# Patient Record
Sex: Female | Born: 1970 | Race: Black or African American | Hispanic: No | Marital: Married | Smoking: Never smoker
Health system: Southern US, Community
[De-identification: ages and names within clinical notes are randomized; demographics above are authoritative.]

## PROBLEM LIST (undated history)

## (undated) DIAGNOSIS — I10 Essential (primary) hypertension: Secondary | ICD-10-CM

---

## 2000-10-23 ENCOUNTER — Emergency Department (HOSPITAL_COMMUNITY): Admission: EM | Admit: 2000-10-23 | Discharge: 2000-10-23 | Payer: Self-pay | Admitting: Emergency Medicine

## 2010-07-16 ENCOUNTER — Emergency Department (HOSPITAL_COMMUNITY): Payer: BC Managed Care – PPO

## 2010-07-16 ENCOUNTER — Emergency Department (HOSPITAL_COMMUNITY)
Admission: EM | Admit: 2010-07-16 | Discharge: 2010-07-16 | Disposition: A | Discharge status: Home / Self Care | Payer: BC Managed Care – PPO | Attending: Emergency Medicine | Admitting: Emergency Medicine

## 2010-07-16 DIAGNOSIS — M25519 Pain in unspecified shoulder: Secondary | ICD-10-CM | POA: Insufficient documentation

## 2010-07-16 DIAGNOSIS — D649 Anemia, unspecified: Secondary | ICD-10-CM | POA: Insufficient documentation

## 2010-07-16 DIAGNOSIS — R0602 Shortness of breath: Secondary | ICD-10-CM | POA: Insufficient documentation

## 2010-07-16 DIAGNOSIS — R071 Chest pain on breathing: Secondary | ICD-10-CM | POA: Insufficient documentation

## 2010-07-16 LAB — DIFFERENTIAL
Basophils Absolute: 0 10*3/uL (ref 0.0–0.1)
Basophils Relative: 0 % (ref 0–1)
Lymphocytes Relative: 39 % (ref 12–46)
Monocytes Absolute: 0.5 10*3/uL (ref 0.1–1.0)
Neutro Abs: 3.7 10*3/uL (ref 1.7–7.7)
Neutrophils Relative %: 53 % (ref 43–77)

## 2010-07-16 LAB — CBC
HCT: 30 % — ABNORMAL LOW (ref 36.0–46.0)
Hemoglobin: 9.6 g/dL — ABNORMAL LOW (ref 12.0–15.0)
MCHC: 32 g/dL (ref 30.0–36.0)
WBC: 7 10*3/uL (ref 4.0–10.5)

## 2010-07-16 LAB — POCT CARDIAC MARKERS
CKMB, poc: 5.1 ng/mL (ref 1.0–8.0)
Myoglobin, poc: 165 ng/mL (ref 12–200)

## 2010-07-16 LAB — BASIC METABOLIC PANEL
CO2: 26 mEq/L (ref 19–32)
Calcium: 9.4 mg/dL (ref 8.4–10.5)
Creatinine, Ser: 0.98 mg/dL (ref 0.4–1.2)
GFR calc Af Amer: >60 mL/min (ref >60)
GFR calc non Af Amer: >60 mL/min (ref >60)
Glucose, Bld: 123 mg/dL — ABNORMAL HIGH (ref 70–99)

## 2010-07-16 LAB — PROTIME-INR: Prothrombin Time: 13.1 seconds (ref 11.6–15.2)

## 2010-07-16 MED ORDER — IOHEXOL 300 MG/ML  SOLN
100.0000 mL | Freq: Once | INTRAMUSCULAR | Status: AC | PRN
  Administered 2010-07-16: 100 mL via INTRAVENOUS

## 2010-11-24 ENCOUNTER — Other Ambulatory Visit: Payer: Self-pay | Admitting: Family Medicine

## 2010-11-24 DIAGNOSIS — Z1231 Encounter for screening mammogram for malignant neoplasm of breast: Secondary | ICD-10-CM

## 2010-12-02 ENCOUNTER — Ambulatory Visit
Admission: RE | Admit: 2010-12-02 | Discharge: 2010-12-02 | Disposition: A | Discharge status: Home / Self Care | Payer: BC Managed Care – PPO | Source: Ambulatory Visit | Attending: Family Medicine | Admitting: Family Medicine

## 2010-12-02 DIAGNOSIS — Z1231 Encounter for screening mammogram for malignant neoplasm of breast: Secondary | ICD-10-CM

## 2011-06-07 ENCOUNTER — Emergency Department (HOSPITAL_COMMUNITY): Payer: BC Managed Care – PPO

## 2011-06-07 ENCOUNTER — Encounter (HOSPITAL_COMMUNITY): Payer: Self-pay

## 2011-06-07 ENCOUNTER — Emergency Department (HOSPITAL_COMMUNITY)
Admission: EM | Admit: 2011-06-07 | Discharge: 2011-06-07 | Disposition: A | Discharge status: Home / Self Care | Payer: BC Managed Care – PPO | Attending: Emergency Medicine | Admitting: Emergency Medicine

## 2011-06-07 DIAGNOSIS — R1110 Vomiting, unspecified: Secondary | ICD-10-CM | POA: Insufficient documentation

## 2011-06-07 DIAGNOSIS — R197 Diarrhea, unspecified: Secondary | ICD-10-CM | POA: Insufficient documentation

## 2011-06-07 DIAGNOSIS — R1013 Epigastric pain: Secondary | ICD-10-CM

## 2011-06-07 LAB — COMPREHENSIVE METABOLIC PANEL
BUN: 15 mg/dL (ref 6–23)
CO2: 22 mEq/L (ref 19–32)
Chloride: 100 mEq/L (ref 96–112)
Creatinine, Ser: 0.93 mg/dL (ref 0.50–1.10)
GFR calc Af Amer: 87 mL/min — ABNORMAL LOW (ref >90)
GFR calc non Af Amer: 75 mL/min — ABNORMAL LOW (ref >90)
Glucose, Bld: 120 mg/dL — ABNORMAL HIGH (ref 70–99)
Total Bilirubin: 0.3 mg/dL (ref 0.3–1.2)

## 2011-06-07 LAB — URINALYSIS, ROUTINE W REFLEX MICROSCOPIC
Bilirubin Urine: NEGATIVE
Glucose, UA: NEGATIVE mg/dL
Hgb urine dipstick: NEGATIVE
Specific Gravity, Urine: 1.019 (ref 1.005–1.030)

## 2011-06-07 LAB — CBC
HCT: 34.1 % — ABNORMAL LOW (ref 36.0–46.0)
MCV: 74.9 fL — ABNORMAL LOW (ref 78.0–100.0)
RBC: 4.55 MIL/uL (ref 3.87–5.11)
WBC: 6.2 10*3/uL (ref 4.0–10.5)

## 2011-06-07 LAB — POCT PREGNANCY, URINE: Preg Test, Ur: NEGATIVE

## 2011-06-07 LAB — URINE MICROSCOPIC-ADD ON

## 2011-06-07 LAB — LIPASE, BLOOD: Lipase: 48 U/L (ref 11–59)

## 2011-06-07 MED ORDER — ONDANSETRON HCL 4 MG/2ML IJ SOLN
4.0000 mg | Freq: Once | INTRAMUSCULAR | Status: AC
  Administered 2011-06-07: 4 mg via INTRAVENOUS
  Filled 2011-06-07: qty 2

## 2011-06-07 MED ORDER — SODIUM CHLORIDE 0.9 % IV BOLUS (SEPSIS)
1000.0000 mL | Freq: Once | INTRAVENOUS | Status: AC
  Administered 2011-06-07: 1000 mL via INTRAVENOUS

## 2011-06-07 MED ORDER — HYDROMORPHONE HCL PF 1 MG/ML IJ SOLN
1.0000 mg | Freq: Once | INTRAMUSCULAR | Status: AC
  Administered 2011-06-07: 1 mg via INTRAVENOUS
  Filled 2011-06-07: qty 1

## 2011-06-07 MED ORDER — RANITIDINE HCL 150 MG PO TABS: 150.0000 mg | ORAL_TABLET | Freq: Two times a day (BID) | ORAL | Status: AC

## 2011-06-07 NOTE — ED Notes (Signed)
Pt c/o abdominal pain since last week, states dx with GI virus. States woke up at 4am with severe abdominal pain radiating through to back with n/v/d.

## 2011-06-07 NOTE — ED Provider Notes (Signed)
History     CSN: [id number]  Arrival date & time 06/07/11  7846   First MD Initiated Contact with Patient 06/07/11 [phone]      Chief Complaint  Patient presents with  . Abdominal Pain  . Emesis  . Diarrhea    (Consider location/radiation/quality/duration/timing/severity/associated sxs/prior treatment) HPI Pt presents with epigastric abdominal pain associaeating with dry heaving and watery diarrhea.  Pt states she had similar symptoms approx 1 week ago- was seen at OSH and diagnosed with GI virus.  Her symptoms had improved until acutely worsening this morning.  Pain described as sharp and cramping in epigastric region.  No fever/chills.  Emesis nonbloody/nonbilious.  No blood in stool. Prior surgery is a csxn only.  There are no other associated symptoms, there are no alleviating or modifying factors.    History reviewed. No pertinent past medical history.  Past Surgical History  Procedure Date  . Cesarean section     No family history on file.  History  Substance Use Topics  . Smoking status: Not on file  . Smokeless tobacco: Not on file  . Alcohol Use: No    OB History    Grav Para Term Preterm Abortions TAB SAB Ect Mult Living                  Review of Systems ROS reviewed and all otherwise negative except for mentioned in HPI  Allergies  Percocet  Home Medications   Current Outpatient Rx  Name Route Sig Dispense Refill  . DICYCLOMINE HCL 20 MG PO TABS Oral Take 20 mg by mouth 4 (four) times daily as needed. Abdominal pain    . RANITIDINE HCL 150 MG PO TABS Oral Take 1 tablet (150 mg total) by mouth 2 (two) times daily. 60 tablet 0    BP 118/70  Pulse 67  Temp(Src) 98.5 F (36.9 C) (Oral)  Resp 18  SpO2 97% Vitals reviewed Physical Exam Physical Examination: General appearance - alert, well appearing, and in no distress Mental status - alert, oriented to person, place, and time Eyes - pupils equal and reactive,  No scleral icterus Mouth - mucous  membranes moist, pharynx normal without lesions Chest - clear to auscultation, no wheezes, rales or rhonchi, symmetric air entry Heart - normal rate, regular rhythm, normal S1, S2, no murmurs, rubs, clicks or gallops Abdomen - soft, ttp in epigastric region, no gaurding or rebound, nondistended, no masses or organomegaly, nabs Back exam - full range of motion, no CVA tenderness Extremities - peripheral pulses normal, no pedal edema, no clubbing or cyanosis Skin - normal coloration and turgor, no rashes  ED Course  Procedures (including critical care time)  11:02 AM pt feels much improved, no further pain, has tolerated po trial without difficulty  Labs Reviewed  URINALYSIS, ROUTINE W REFLEX MICROSCOPIC - Abnormal; Notable for the following:    APPearance CLOUDY (*)    Leukocytes, UA TRACE (*)    All other components within normal limits  CBC - Abnormal; Notable for the following:    Hemoglobin 10.7 (*)    HCT 34.1 (*)    MCV 74.9 (*)    MCH 23.5 (*)    RDW 18.7 (*)    All other components within normal limits  COMPREHENSIVE METABOLIC PANEL - Abnormal; Notable for the following:    Sodium 133 (*)    Glucose, Bld 120 (*)    Total Protein 8.6 (*)    GFR calc non Af Amer 75 (*)  GFR calc Af Amer 87 (*)    All other components within normal limits  URINE MICROSCOPIC-ADD ON - Abnormal; Notable for the following:    Squamous Epithelial / LPF FEW (*)    Bacteria, UA MANY (*)    All other components within normal limits  LIPASE, BLOOD  POCT PREGNANCY, URINE  URINE CULTURE   US Abdomen Complete  06/07/2011  *RADIOLOGY REPORT*  Clinical Data:  Abdominal pain.  Emesis and diarrhea.  COMPLETE ABDOMINAL ULTRASOUND  Comparison:  Chest CT 07/16/2010.  Findings:  Gallbladder:  Comet-tail artifact within the nondependent gallbladder wall.  Image 19.  No stone, wall thickening, or pericholecystic fluid. Sonographic Murphy's sign was not elicited.  Common bile duct: Normal, 5 mm.  Liver:  Normal in echogenicity, without focal lesion.  IVC: Negative  Pancreas:  Not visualized due to patient body habitus and overlying bowel gas.  Spleen:  Normal in size and echogenicity.  Right Kidney:  10.9 cm. No hydronephrosis.  Left Kidney:  11.8 cm. No hydronephrosis.  Abdominal aorta:  Nonaneurysmal without ascites.  The distal aorta is not well visualized.  Exam is mildly degraded by patient body habitus.  IMPRESSION:  1.  Comet-tail artifact within the gallbladder.  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  If imaging confirmation is desired, MRI/MRCP could be performed. 2.  No explanation for patient's symptoms. 3. Decreased sensitivity and specificity exam due to technique related factors, as described above.  Original Report Authenticated By: Consuello Bossier, M.D.     1. Epigastric abdominal pain       MDM  Pt presents with epigastric pain with emesis x 1, and watery diarrhea.  Workup today is reassuring including abdominal ultrasound.  Pt feels much improved after IV hydration, pain meds and antiemetics.  I will prescribe an H2 blocker as her symptoms may improve with tx for reflux- she has rx for nausea meds which were prescribed at her visit last week.  Discharged with strict return precautions.  Pt agreeable with plan.        Ethelda Chick, MD 06/07/11 [phone]

## 2011-06-07 NOTE — Discharge Instructions (Signed)
Return to the ED with any concerns including fever, vomiting and not able to keep down liquids, worsening abdominal pain, decreased level of alertness/lethargy, or any other alarming symptoms

## 2011-06-08 LAB — URINE CULTURE
Colony Count: 25000
Culture  Setup Time: 201304101441

## 2012-10-04 IMAGING — CR DG CHEST 2V
2 series · 2 of 2 positions shown · non-contrast
Comparison: None.

CLINICAL DATA: Chest pain

CHEST - 2 VIEW

[w chest pa]
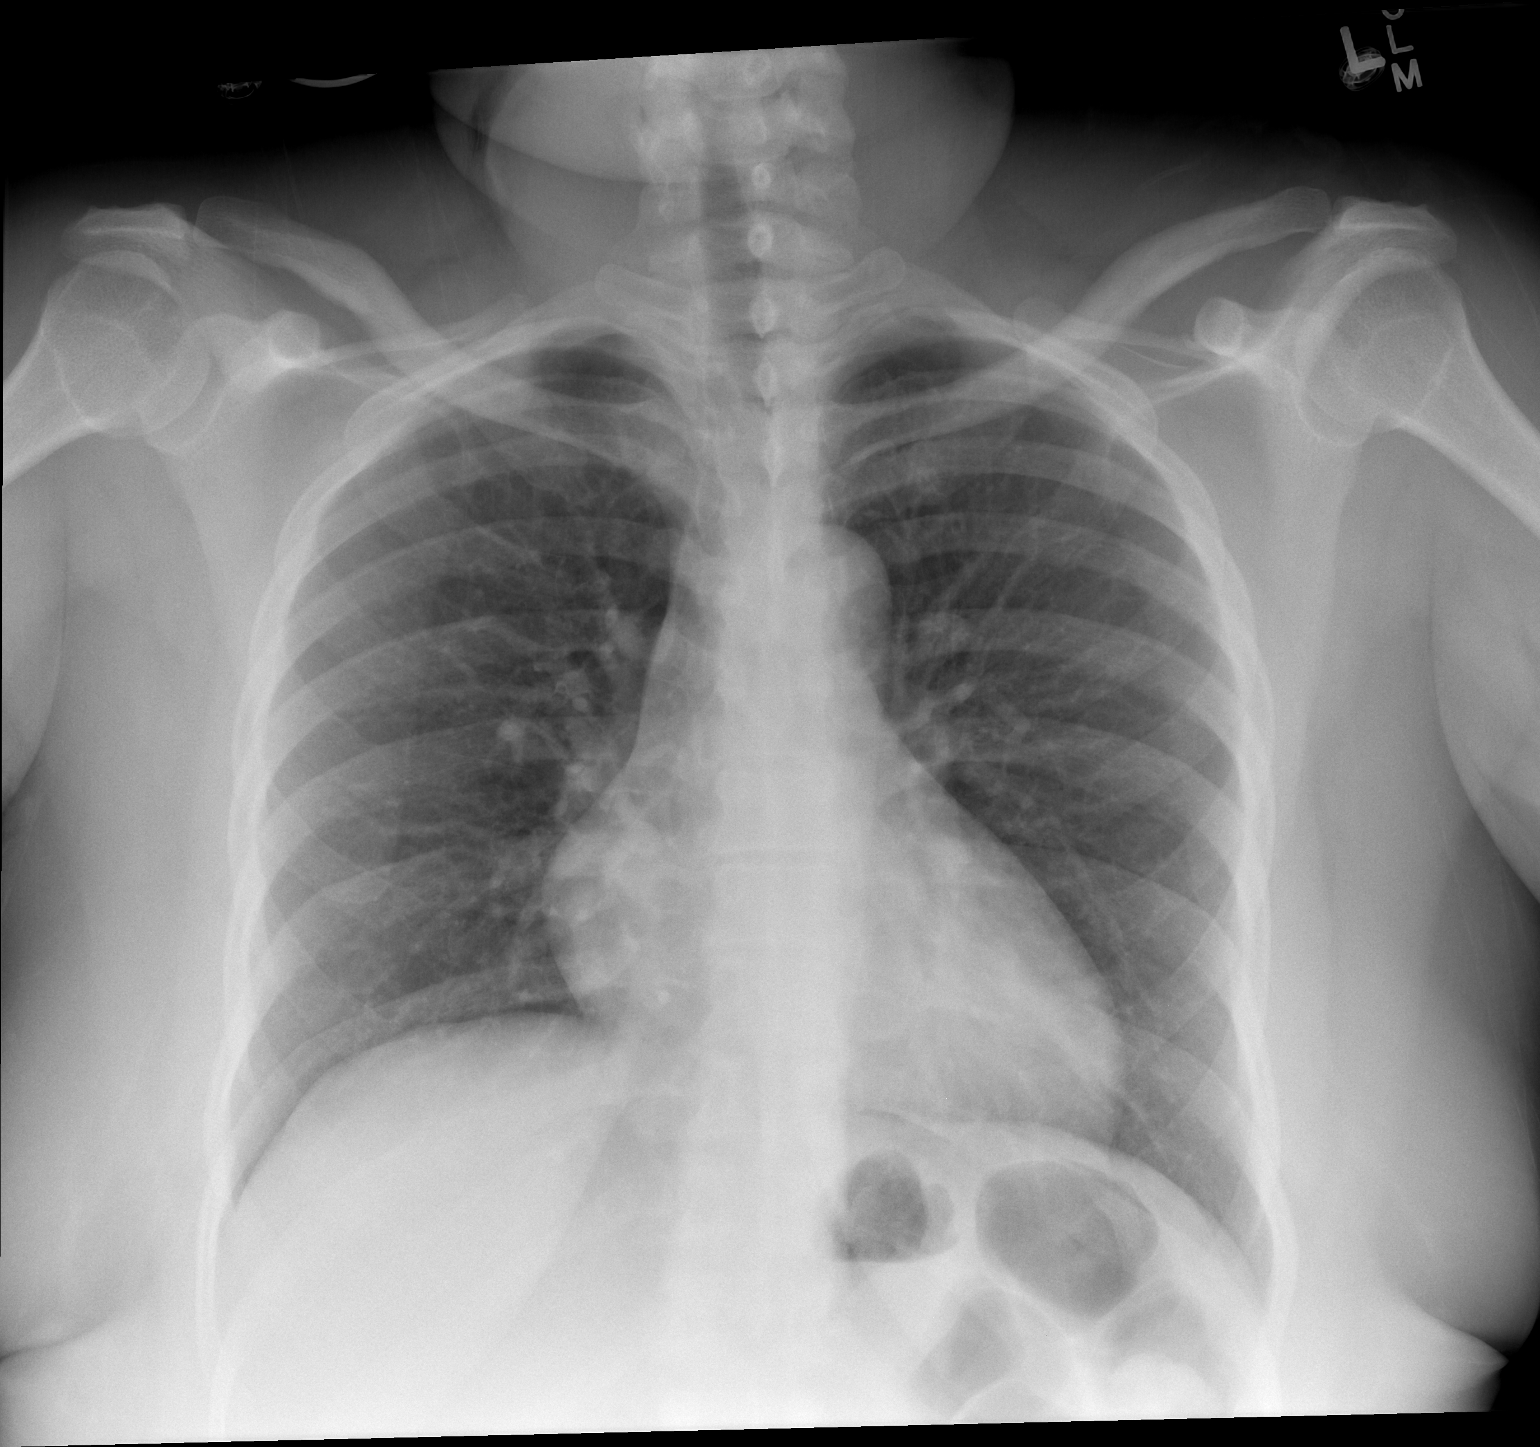

[w chest lat]
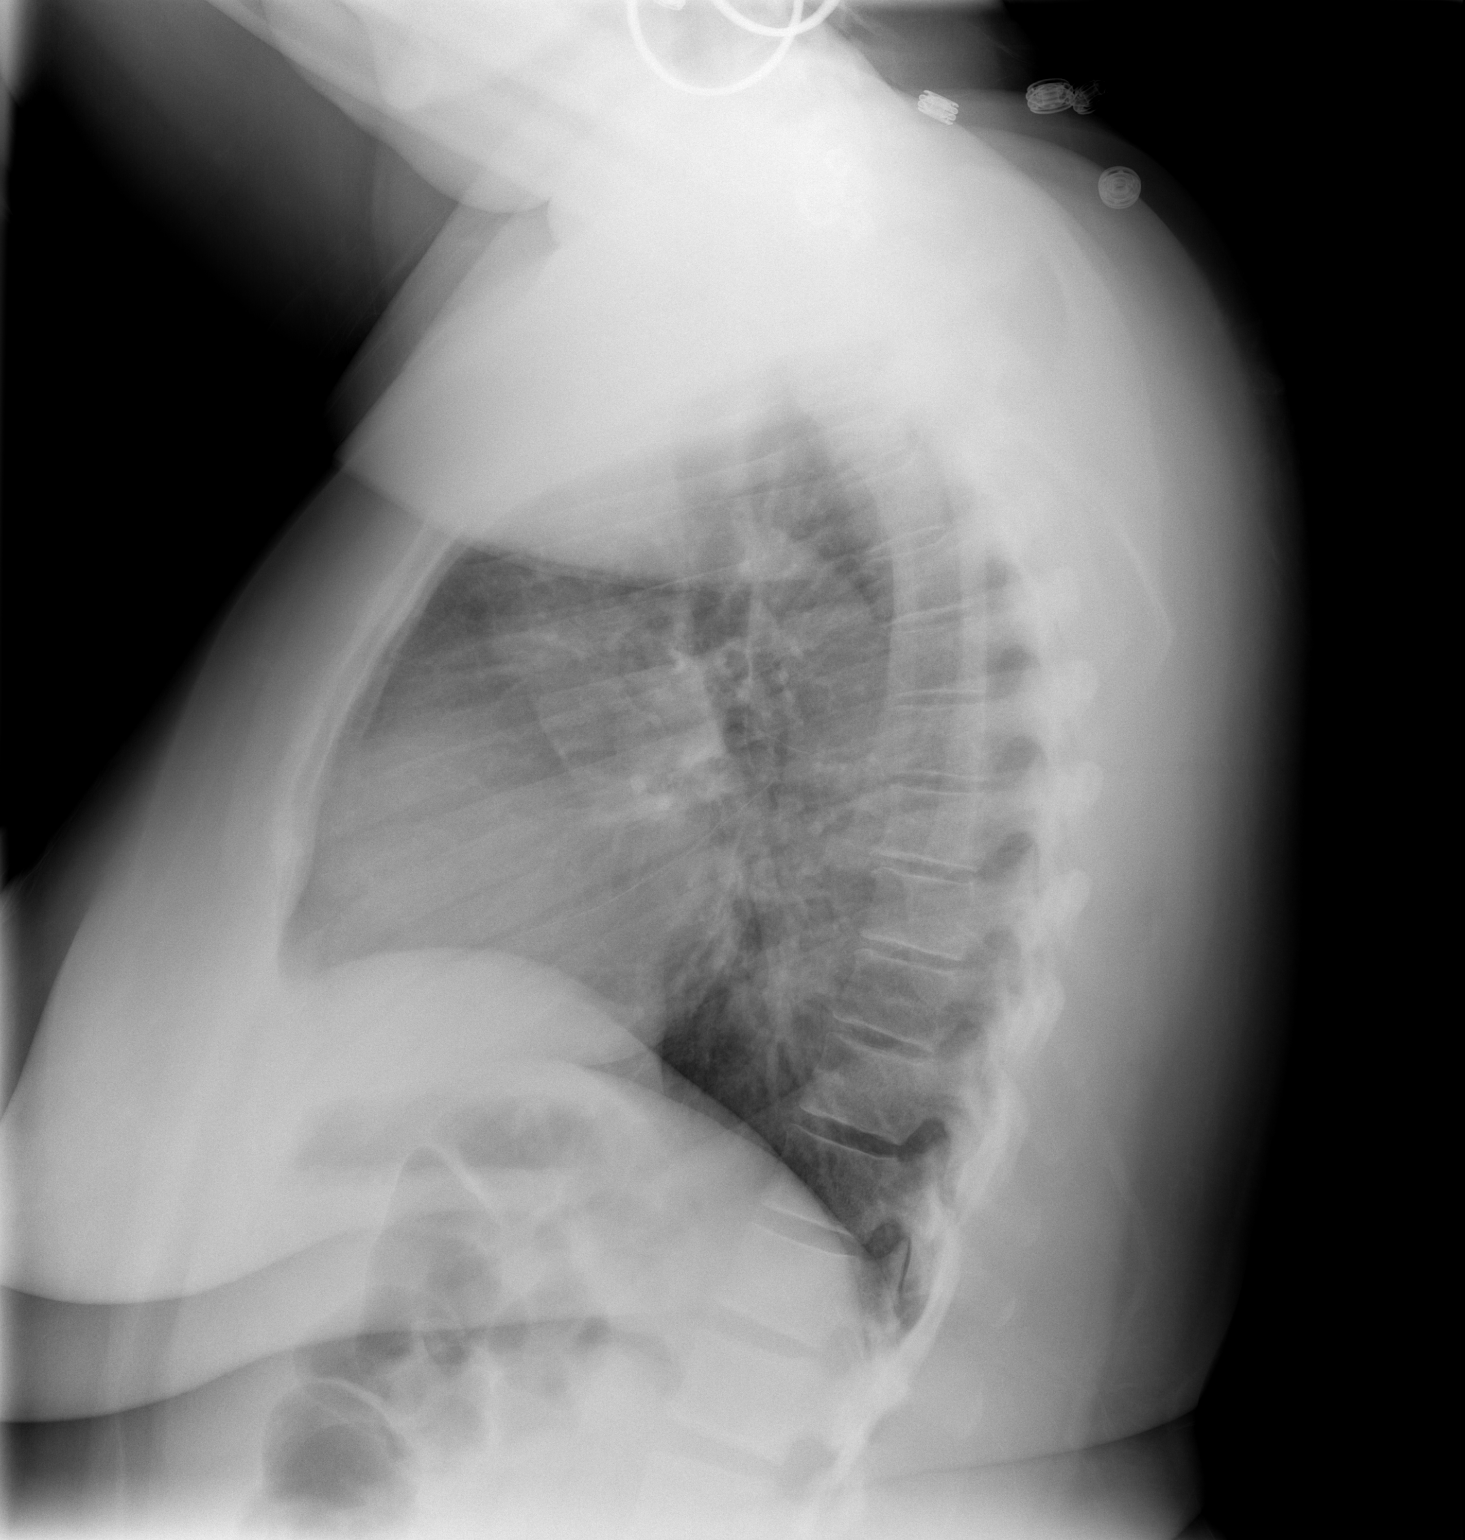

[2 of 2 positions shown; findings below may reference images not displayed]

FINDINGS: Normal cardiac and mediastinal silhouette.  Poor
inspiration.     No lobar consolidation.  No effusion or
pneumothorax.  Bones unremarkable.
IMPRESSION: Poor inspiratory effort. No definite active process.

## 2014-03-16 ENCOUNTER — Inpatient Hospital Stay (HOSPITAL_COMMUNITY)
Admission: AD | Admit: 2014-03-16 | Discharge: 2014-03-16 | Disposition: A | Discharge status: Home / Self Care | Payer: BC Managed Care – PPO | Source: Ambulatory Visit | Attending: Obstetrics & Gynecology | Admitting: Obstetrics & Gynecology

## 2014-03-16 ENCOUNTER — Other Ambulatory Visit: Payer: Self-pay | Admitting: Obstetrics & Gynecology

## 2014-03-16 DIAGNOSIS — D509 Iron deficiency anemia, unspecified: Secondary | ICD-10-CM | POA: Diagnosis present

## 2014-03-16 MED ORDER — SODIUM CHLORIDE 0.9 % IV SOLN
1020.0000 mg | Freq: Once | INTRAVENOUS | Status: AC
  Administered 2014-03-16: 1020 mg via INTRAVENOUS
  Filled 2014-03-16: qty 34

## 2014-03-16 NOTE — Discharge Instructions (Signed)
Iron Deficiency Anemia Anemia is a condition in which there are less red blood cells or hemoglobin in the blood than normal. Hemoglobin is the part of red blood cells that carries oxygen. Iron deficiency anemia is anemia caused by too little iron. It is the most common type of anemia. It may leave you tired and short of breath. CAUSES   Lack of iron in the diet.  Poor absorption of iron, as seen with intestinal disorders.  Intestinal bleeding.  Heavy periods. SIGNS AND SYMPTOMS  Mild anemia may not be noticeable. Symptoms may include:  Fatigue.  Headache.  Pale skin.  Weakness.  Tiredness.  Shortness of breath.  Dizziness.  Cold hands and feet.  Fast or irregular heartbeat. DIAGNOSIS  Diagnosis requires a thorough evaluation and physical exam by your health care provider. Blood tests are generally used to confirm iron deficiency anemia. Additional tests may be done to find the underlying cause of your anemia. These may include:  Testing for blood in the stool (fecal occult blood test).  A procedure to see inside the colon and rectum (colonoscopy).  A procedure to see inside the esophagus and stomach (endoscopy). TREATMENT  Iron deficiency anemia is treated by correcting the cause of the deficiency. Treatment may involve:  Adding iron-rich foods to your diet.  Taking iron supplements. Pregnant or breastfeeding women need to take extra iron because their normal diet usually does not provide the required amount.  Taking vitamins. Vitamin C improves the absorption of iron. Your health care provider may recommend that you take your iron tablets with a glass of orange juice or vitamin C supplement.  Medicines to make heavy menstrual flow lighter.  Surgery. HOME CARE INSTRUCTIONS   Take iron as directed by your health care provider.  If you cannot tolerate taking iron supplements by mouth, talk to your health care provider about taking them through a vein  (intravenously) or an injection into a muscle.  For the best iron absorption, iron supplements should be taken on an empty stomach. If you cannot tolerate them on an empty stomach, you may need to take them with food.  Do not drink milk or take antacids at the same time as your iron supplements. Milk and antacids may interfere with the absorption of iron.  Iron supplements can cause constipation. Make sure to include fiber in your diet to prevent constipation. A stool softener may also be recommended.  Take vitamins as directed by your health care provider.  Eat a diet rich in iron. Foods high in iron include liver, lean beef, whole-grain bread, eggs, dried fruit, and dark green leafy vegetables. SEEK IMMEDIATE MEDICAL CARE IF:   You faint. If this happens, do not drive. Call your local emergency services (911 in [place] if no other help is available.  You have chest pain.  You feel nauseous or vomit.  You have severe or increased shortness of breath with activity.  You feel weak.  You have a rapid heartbeat.  You have unexplained sweating.  You become light-headed when getting up from a chair or bed. MAKE SURE YOU:   Understand these instructions.  Will watch your condition.  Will get help right away if you are not doing well or get worse. Document Released: 02/11/2000 Document Revised: 02/18/2013 Document Reviewed: 10/21/2012 [employer] Patient Information 2015 [employer]. This information is not intended to replace advice given to you by your health care provider. Make sure you discuss any questions you have with your health care provider.  

## 2014-03-16 NOTE — MAU Note (Signed)
Sent from office for IV feraheme infusion.

## 2015-12-23 ENCOUNTER — Ambulatory Visit (HOSPITAL_COMMUNITY)
Admission: EM | Admit: 2015-12-23 | Discharge: 2015-12-23 | Disposition: A | Discharge status: Home / Self Care | Payer: Worker's Compensation | Attending: Internal Medicine | Admitting: Internal Medicine

## 2015-12-23 ENCOUNTER — Encounter (HOSPITAL_COMMUNITY): Payer: Self-pay | Admitting: Emergency Medicine

## 2015-12-23 DIAGNOSIS — S86812A Strain of other muscle(s) and tendon(s) at lower leg level, left leg, initial encounter: Secondary | ICD-10-CM

## 2015-12-23 DIAGNOSIS — S76019A Strain of muscle, fascia and tendon of unspecified hip, initial encounter: Secondary | ICD-10-CM

## 2015-12-23 DIAGNOSIS — W010XXA Fall on same level from slipping, tripping and stumbling without subsequent striking against object, initial encounter: Secondary | ICD-10-CM

## 2015-12-23 DIAGNOSIS — M25562 Pain in left knee: Secondary | ICD-10-CM | POA: Diagnosis not present

## 2015-12-23 HISTORY — DX: Essential (primary) hypertension: I10

## 2015-12-23 NOTE — ED Triage Notes (Signed)
Pt reports she slipped on floor yest at work and inj her left knee and left hip  Pain increases w/activity  Slow gait.... A&O x4... NAD

## 2015-12-23 NOTE — Discharge Instructions (Signed)
Continue to wear the knee sleeve for the next several days as needed. Apply ice over the front part of the knee 2-3 times a day. Perform rehabilitation movement has demonstrated. Start slow. May take ibuprofen or Aleve if needed for discomfort. For worsening, new symptoms or problems or not getting better in the next several days may return or follow-up with your primary care provider.

## 2015-12-23 NOTE — ED Provider Notes (Signed)
CSN: [id number]     Arrival date & time 12/23/15  1724 History   First MD Initiated Contact with Patient 12/23/15 1810     Chief Complaint  Patient presents with  . Knee Injury   (Consider location/radiation/quality/duration/timing/severity/associated sxs/prior Treatment) 45 year old obese female states that she slipped and fell yesterday. She states she landed on her left buttock. She is complaining of pain deep in the left hip that only hurts with certain turns and twists of the hip and left low back particularly with ambulation, and left knee pain. She is currently utilizing a knee sleeve which helps. She denies falling directly onto the left knee The knee pain is anterior.      Past Medical History:  Diagnosis Date  . Hypertension    Past Surgical History:  Procedure Laterality Date  . CESAREAN SECTION     No family history on file. Social History  Substance Use Topics  . Smoking status: Never Smoker  . Smokeless tobacco: Never Used  . Alcohol use No   OB History    No data available     Review of Systems  Constitutional: Negative for activity change, chills and fever.  HENT: Negative.   Respiratory: Negative.   Cardiovascular: Negative.   Musculoskeletal: Positive for back pain, gait problem and myalgias. Negative for joint swelling, neck pain and neck stiffness.       As per HPI  Skin: Negative for color change, pallor, rash and wound.  All other systems reviewed and are negative.   Allergies  Percocet [oxycodone-acetaminophen]  Home Medications   Prior to Admission medications   Medication Sig Start Date End Date Taking? Authorizing Provider  hydrochlorothiazide (HYDRODIURIL) 25 MG tablet Take 25 mg by mouth daily.   Yes Historical Provider, MD  Fe Cbn-Fe Gluc-FA-B12-C-DSS (FERRALET 90) 90-1 MG TABS Take 1 tablet by mouth 2 (two) times daily.    Historical Provider, MD  ranitidine (ZANTAC) 150 MG tablet Take 1 tablet (150 mg total) by mouth 2 (two) times  daily. 06/07/11 06/06/12  Jerelyn Scott, MD   Meds Ordered and Administered this Visit  Medications - No data to display  BP 146/89 (BP Location: Left Arm)   Pulse 70   Temp 98.5 F (36.9 C) (Oral)   Resp 16   SpO2 100%  No data found.   Physical Exam  Constitutional: She is oriented to person, place, and time. She appears well-developed and well-nourished. No distress.  HENT:  Head: Normocephalic and atraumatic.  Eyes: EOM are normal. Pupils are equal, round, and reactive to light.  Neck: Normal range of motion. Neck supple.  Cardiovascular: Normal rate.   Pulmonary/Chest: Effort normal.  Musculoskeletal: She exhibits no edema or deformity.  Left low back and hip without swelling, deformity or palpable tenderness. Patient is able to ambulate with full weightbearing. She is able to stand only right leg and flex the hip forward, backwards, abduction and abduction without limitation or pain.  Left knee without swelling. There is minor tenderness to the anterior aspect. Extension to 180. Flexion limited to 90. Additional angle closure with flexion increases pain along the anterior patella tendon. No edema. No bony tenderness. Patella midline and glides. Tract normally. No appreciable laxity. Negative drawer, negative varus, negative valgus. Rotation internal and external does not produce pain nor is limited. Distal neurovascular motor sensory grossly intact.  Lymphadenopathy:    She has no cervical adenopathy.  Neurological: She is alert and oriented to person, place, and time. No cranial nerve  deficit.  Skin: Skin is warm and dry. Capillary refill takes less than 2 seconds.  Psychiatric: She has a normal mood and affect.  Vitals reviewed.   Urgent Care Course   Clinical Course    Procedures (including critical care time)  Labs Review Labs Reviewed - No data to display  Imaging Review No results found.   Visual Acuity Review  Right Eye Distance:   Left Eye Distance:    Bilateral Distance:    Right Eye Near:   Left Eye Near:    Bilateral Near:         MDM   1. Acute pain of left knee   2. Patellar tendon strain, left, initial encounter   3. Hip strain, initial encounter   4. Fall on same level from slipping, tripping or stumbling, initial encounter    Continue to wear the knee sleeve for the next several days as needed. Apply ice over the front part of the knee 2-3 times a day. Perform rehabilitation movement has demonstrated. Start slow. May take ibuprofen or Aleve if needed for discomfort. For worsening, new symptoms or problems or not getting better in the next several days may return or follow-up with your primary care provider.     Hayden Rasmussenavid Yolanda Huffstetler, NP 12/23/15 [phone]

## 2019-04-04 ENCOUNTER — Inpatient Hospital Stay (HOSPITAL_COMMUNITY)
Admission: EM | Admit: 2019-04-04 | Discharge: 2019-04-15 | DRG: 330 | Disposition: A | Discharge status: Home Health | Payer: BC Managed Care – PPO | Attending: Physician Assistant | Admitting: Physician Assistant

## 2019-04-04 ENCOUNTER — Emergency Department (HOSPITAL_COMMUNITY): Payer: BC Managed Care – PPO

## 2019-04-04 ENCOUNTER — Other Ambulatory Visit: Payer: Self-pay

## 2019-04-04 ENCOUNTER — Encounter (HOSPITAL_COMMUNITY): Payer: Self-pay | Admitting: Student

## 2019-04-04 DIAGNOSIS — L7632 Postprocedural hematoma of skin and subcutaneous tissue following other procedure: Secondary | ICD-10-CM | POA: Diagnosis not present

## 2019-04-04 DIAGNOSIS — I10 Essential (primary) hypertension: Secondary | ICD-10-CM | POA: Diagnosis present

## 2019-04-04 DIAGNOSIS — R188 Other ascites: Secondary | ICD-10-CM | POA: Diagnosis not present

## 2019-04-04 DIAGNOSIS — K567 Ileus, unspecified: Secondary | ICD-10-CM | POA: Diagnosis not present

## 2019-04-04 DIAGNOSIS — Y836 Removal of other organ (partial) (total) as the cause of abnormal reaction of the patient, or of later complication, without mention of misadventure at the time of the procedure: Secondary | ICD-10-CM | POA: Diagnosis not present

## 2019-04-04 DIAGNOSIS — Z20822 Contact with and (suspected) exposure to covid-19: Secondary | ICD-10-CM | POA: Diagnosis present

## 2019-04-04 DIAGNOSIS — K56609 Unspecified intestinal obstruction, unspecified as to partial versus complete obstruction: Secondary | ICD-10-CM

## 2019-04-04 DIAGNOSIS — K562 Volvulus: Secondary | ICD-10-CM | POA: Diagnosis present

## 2019-04-04 DIAGNOSIS — R1110 Vomiting, unspecified: Secondary | ICD-10-CM

## 2019-04-04 LAB — COMPREHENSIVE METABOLIC PANEL
ALT: 27 U/L (ref 0–44)
AST: 22 U/L (ref 15–41)
Albumin: 4.7 g/dL (ref 3.5–5.0)
Alkaline Phosphatase: 76 U/L (ref 38–126)
Anion gap: 10 (ref 5–15)
BUN: 15 mg/dL (ref 6–20)
CO2: 25 mmol/L (ref 22–32)
Calcium: 9.5 mg/dL (ref 8.9–10.3)
Chloride: 103 mmol/L (ref 98–111)
Creatinine, Ser: 0.96 mg/dL (ref 0.44–1.00)
GFR calc Af Amer: >60 mL/min (ref >60)
GFR calc non Af Amer: >60 mL/min (ref >60)
Glucose, Bld: 112 mg/dL — ABNORMAL HIGH (ref 70–99)
Potassium: 3.8 mmol/L (ref 3.5–5.1)
Sodium: 138 mmol/L (ref 135–145)
Total Bilirubin: 1.1 mg/dL (ref 0.3–1.2)
Total Protein: 9.2 g/dL — ABNORMAL HIGH (ref 6.5–8.1)

## 2019-04-04 LAB — URINALYSIS, ROUTINE W REFLEX MICROSCOPIC
Bacteria, UA: NONE SEEN
Bilirubin Urine: NEGATIVE
Glucose, UA: NEGATIVE mg/dL
Hgb urine dipstick: NEGATIVE
Ketones, ur: 20 mg/dL — AB
Nitrite: NEGATIVE
Protein, ur: 30 mg/dL — AB
Specific Gravity, Urine: 1.027 (ref 1.005–1.030)
pH: 5 (ref 5.0–8.0)

## 2019-04-04 LAB — I-STAT BETA HCG BLOOD, ED (MC, WL, AP ONLY): I-stat hCG, quantitative: <5 m[IU]/mL (ref <5)

## 2019-04-04 LAB — CBC WITH DIFFERENTIAL/PLATELET
Abs Immature Granulocytes: 0.03 10*3/uL (ref 0.00–0.07)
Basophils Absolute: 0 10*3/uL (ref 0.0–0.1)
Basophils Relative: 0 %
Eosinophils Absolute: 0 10*3/uL (ref 0.0–0.5)
Eosinophils Relative: 0 %
HCT: 46.3 % — ABNORMAL HIGH (ref 36.0–46.0)
Hemoglobin: 14.7 g/dL (ref 12.0–15.0)
Immature Granulocytes: 0 %
Lymphocytes Relative: 22 %
Lymphs Abs: 1.5 10*3/uL (ref 0.7–4.0)
MCH: 27.9 pg (ref 26.0–34.0)
MCHC: 31.7 g/dL (ref 30.0–36.0)
MCV: 88 fL (ref 80.0–100.0)
Monocytes Absolute: 0.3 10*3/uL (ref 0.1–1.0)
Monocytes Relative: 4 %
Neutro Abs: 5.1 10*3/uL (ref 1.7–7.7)
Neutrophils Relative %: 74 %
Platelets: 349 10*3/uL (ref 150–400)
RBC: 5.26 MIL/uL — ABNORMAL HIGH (ref 3.87–5.11)
RDW: 14.1 % (ref 11.5–15.5)
WBC: 7 10*3/uL (ref 4.0–10.5)
nRBC: 0 % (ref 0.0–0.2)

## 2019-04-04 LAB — RESPIRATORY PANEL BY RT PCR (FLU A&B, COVID)
Influenza A by PCR: NEGATIVE
Influenza B by PCR: NEGATIVE
SARS Coronavirus 2 by RT PCR: NEGATIVE

## 2019-04-04 LAB — LIPASE, BLOOD: Lipase: 47 U/L (ref 11–51)

## 2019-04-04 MED ORDER — SODIUM CHLORIDE 0.9 % IV BOLUS
1000.0000 mL | Freq: Once | INTRAVENOUS | Status: AC
  Administered 2019-04-04: 1000 mL via INTRAVENOUS

## 2019-04-04 MED ORDER — IOHEXOL 300 MG/ML  SOLN
100.0000 mL | Freq: Once | INTRAMUSCULAR | Status: AC | PRN
  Administered 2019-04-04: 100 mL via INTRAVENOUS

## 2019-04-04 MED ORDER — FENTANYL CITRATE (PF) 100 MCG/2ML IJ SOLN
12.5000 ug | INTRAMUSCULAR | Status: DC | PRN
  Administered 2019-04-05: 25 ug via INTRAVENOUS
  Administered 2019-04-05: 12.5 ug via INTRAVENOUS
  Administered 2019-04-05 (×10): 25 ug via INTRAVENOUS
  Filled 2019-04-04 (×13): qty 2

## 2019-04-04 MED ORDER — KCL IN DEXTROSE-NACL 20-5-0.9 MEQ/L-%-% IV SOLN
INTRAVENOUS | Status: DC
  Administered 2019-04-04: 100 mL/h via INTRAVENOUS
  Filled 2019-04-04 (×6): qty 1000

## 2019-04-04 MED ORDER — ONDANSETRON HCL 4 MG/2ML IJ SOLN
4.0000 mg | Freq: Four times a day (QID) | INTRAMUSCULAR | Status: DC | PRN
  Administered 2019-04-07 – 2019-04-10 (×5): 4 mg via INTRAVENOUS
  Filled 2019-04-04 (×5): qty 2

## 2019-04-04 MED ORDER — LABETALOL HCL 5 MG/ML IV SOLN
5.0000 mg | Freq: Four times a day (QID) | INTRAVENOUS | Status: DC | PRN
  Filled 2019-04-04 (×2): qty 4

## 2019-04-04 MED ORDER — ONDANSETRON 4 MG PO TBDP: 4.0000 mg | ORAL_TABLET | Freq: Four times a day (QID) | ORAL | Status: DC | PRN

## 2019-04-04 MED ORDER — FENTANYL CITRATE (PF) 100 MCG/2ML IJ SOLN
50.0000 ug | Freq: Once | INTRAMUSCULAR | Status: AC
  Administered 2019-04-04: 19:00:00 50 ug via INTRAVENOUS
  Filled 2019-04-04: qty 2

## 2019-04-04 MED ORDER — ONDANSETRON HCL 4 MG/2ML IJ SOLN
4.0000 mg | Freq: Once | INTRAMUSCULAR | Status: AC
  Administered 2019-04-04: 4 mg via INTRAVENOUS
  Filled 2019-04-04: qty 2

## 2019-04-04 MED ORDER — PANTOPRAZOLE SODIUM 40 MG IV SOLR
40.0000 mg | Freq: Every day | INTRAVENOUS | Status: DC
  Administered 2019-04-04 – 2019-04-12 (×9): 40 mg via INTRAVENOUS
  Filled 2019-04-04 (×10): qty 40

## 2019-04-04 NOTE — ED Provider Notes (Signed)
[facility] Note   CSN: [id number] Arrival date & time: 04/04/19  1324     History Chief Complaint  Patient presents with  . Emesis  . Diarrhea    Tasha Bishop is a 49 y.o. female with a history of hypertension who presents to the emergency department for evaluation of nausea, vomiting, and diarrhea that began at 2:45 AM today.  Patient states that she was having some abdominal discomfort and urinary frequency which prompted a visit to her OB/GYN 02/01, her urine appeared infected, she was started on an unknown antibiotic for a UTI which she started taking as prescribed.  She called and spoke with her OB/GYN 02/03 as her symptoms were not improving, she states that her culture came back abnormal and they switched her antibiotic that day.  She believes that she was switched to Keflex twice per day for 3 days.  She took this yesterday for the first time, around 245 this morning she developed nausea, vomiting, and diarrhea.  States that she has had several episodes of emesis and diarrhea.  Diarrhea is watery.  No blood in emesis or stool.  She returned to her OB/GYN doctor today because of her symptoms, they did a pelvic ultrasound per her report which she states did not show any significant abnormalities, she was sent to the emergency department for further assessment.  She states she feels very dehydrated and generally weak.  She relays that overall her urinary symptoms and abdominal discomfort are fairly resolved at this point.  Denies fever, chills, flank pain, dysuria, vaginal bleeding, vaginal discharge, current abdominal pain, chest pain, dyspnea, or syncope.  Denies recent foreign travel. HPI     Past Medical History:  Diagnosis Date  . Hypertension     There are no problems to display for this patient.   Past Surgical History:  Procedure Laterality Date  . CESAREAN SECTION       OB History   No obstetric history on file.      No family history on file.  Social History   Tobacco Use  . Smoking status: Never Smoker  . Smokeless tobacco: Never Used  Substance Use Topics  . Alcohol use: No  . Drug use: No    Home Medications Prior to Admission medications   Medication Sig Start Date End Date Taking? Authorizing Provider  Fe Cbn-Fe Gluc-FA-B12-C-DSS (FERRALET 90) 90-1 MG TABS Take 1 tablet by mouth 2 (two) times daily.    [provider]  hydrochlorothiazide (HYDRODIURIL) 25 MG tablet Take 25 mg by mouth daily.    [provider]  ranitidine (ZANTAC) 150 MG tablet Take 1 tablet (150 mg total) by mouth 2 (two) times daily. 06/07/11 06/06/12  Mabe, Latanya Maudlin, MD    Allergies    Percocet [oxycodone-acetaminophen]  Review of Systems   Review of Systems  Constitutional: Positive for fatigue. Negative for chills and fever.  Respiratory: Negative for shortness of breath.   Cardiovascular: Negative for chest pain.  Gastrointestinal: Positive for abdominal pain (resolving), diarrhea, nausea and vomiting. Negative for blood in stool and constipation.  Genitourinary: Positive for frequency (resolving). Negative for dysuria, vaginal bleeding and vaginal discharge.  Neurological: Positive for weakness (generalized). Negative for syncope.  All other systems reviewed and are negative.   Physical Exam Updated Vital Signs BP (!) 201/141 (BP Location: Left Arm)   Pulse (!) 102   Temp 98 F (36.7 C) (Oral)   Resp 19   Ht 5\' 5"  (  1.651 m)   Wt 103 kg   SpO2 98%   BMI 37.77 kg/m   Physical Exam Vitals and nursing note reviewed.  Constitutional:      General: She is not in acute distress.    Appearance: She is well-developed. She is not toxic-appearing.  HENT:     Head: Normocephalic and atraumatic.     Mouth/Throat:     Mouth: Mucous membranes are dry.  Eyes:     General:        Right eye: No discharge.        Left eye: No discharge.     Conjunctiva/sclera: Conjunctivae normal.   Cardiovascular:     Rate and Rhythm: Normal rate and regular rhythm.  Pulmonary:     Effort: Pulmonary effort is normal. No respiratory distress.     Breath sounds: Normal breath sounds. No wheezing, rhonchi or rales.  Abdominal:     General: There is no distension.     Palpations: Abdomen is soft.     Tenderness: There is no abdominal tenderness. There is no right CVA tenderness, left CVA tenderness, guarding or rebound.  Musculoskeletal:     Cervical back: Neck supple.  Skin:    General: Skin is warm and dry.     Findings: No rash.  Neurological:     Mental Status: She is alert.     Comments: Clear speech.   Psychiatric:        Behavior: Behavior normal.    ED Results / Procedures / Treatments   Labs (all labs ordered are listed, but only abnormal results are displayed) Labs Reviewed  COMPREHENSIVE METABOLIC PANEL - Abnormal; Notable for the following components:      Result Value   Glucose, Bld 112 (*)    Total Protein 9.2 (*)    All other components within normal limits  CBC WITH DIFFERENTIAL/PLATELET - Abnormal; Notable for the following components:   RBC 5.26 (*)    HCT 46.3 (*)    All other components within normal limits  URINALYSIS, ROUTINE W REFLEX MICROSCOPIC - Abnormal; Notable for the following components:   Color, Urine BLUE (*)    APPearance TURBID (*)    Ketones, ur 20 (*)    Protein, ur 30 (*)    Leukocytes,Ua TRACE (*)    All other components within normal limits  URINE CULTURE  C DIFFICILE QUICK SCREEN W PCR REFLEX  GI PATHOGEN PANEL BY PCR, STOOL  RESPIRATORY PANEL BY RT PCR (FLU A&B, COVID)  LIPASE, BLOOD  I-STAT BETA HCG BLOOD, ED ([facility], [facility], [facility] ONLY)    EKG None  Radiology CT Abdomen Pelvis W Contrast  Result Date: 04/04/2019 CLINICAL DATA:  Abdominal pain, worsening nausea and vomiting, recent treatment for UTI EXAM: CT ABDOMEN AND PELVIS WITH CONTRAST TECHNIQUE: Multidetector CT imaging of the abdomen and pelvis was performed using the  standard protocol following bolus administration of intravenous contrast. CONTRAST:  OMNIPAQUE IOHEXOL 300 MG/ML  SOLN COMPARISON:  Abdominal ultrasound 06/07/2011 FINDINGS: Lower chest: Lung bases are clear. Normal heart size. No pericardial effusion. Hepatobiliary: No focal liver abnormality is seen. No gallstones, gallbladder wall thickening, or biliary dilatation. Pancreas: Mild compression of the pancreatic body and proximal tail by the distended, displaced cecum. No pancreatic ductal dilatation. Spleen: Normal in size without focal abnormality. Adrenals/Urinary Tract: Adrenal glands are unremarkable. Kidneys are normal, without renal calculi, focal lesion, or hydronephrosis. Mild bladder wall thickening and perivesicular hazy stranding, poorly assessed given poor distension of the  bladder on this CT examination. Stomach/Bowel: Small hiatal hernia. Distal stomach and duodenum are unremarkable. There is air and fluid distention of the distal small bowel with a twisting closed loop obstruction involving the terminal ileum and proximal cecum which is displaced into the left upper quadrant. Paired transition points are noted in the mid abdomen (2/54). There is mild mural thickening and hyperemia of the distended cecum with adjacent inflammatory changes and likely reactive fluid tracking in the pericolic gutters to the deep pelvis. Distal colon is decompressed. Vascular/Lymphatic: Atherosclerotic plaque within the normal caliber aorta. Twisting of the mesenteric vessels supplying the cecum and terminal ileum involves with the process detailed above. Some mild mesenteric congestion is noted. Reproductive: Anteverted uterus. Bandlike hypoattenuation in the lower uterine segment likely reflecting postsurgical change related to prior Caesarean. Additional fluid attenuation cystic structures towards the cervix likely nabothian cysts. No concerning adnexal lesions. Other: Small volume free fluid in the pelvis, favor  reactive. Inflammatory changes adjacent the distended cecal loop in the upper midline and left upper quadrant. No free air is seen. Mild posterior body wall edema. Musculoskeletal: No acute osseous abnormality or suspicious osseous lesion. Facet degenerative changes are noted in the spine at L4-5. Corticated crescentic calcification along the right superior acetabular rim, may reflect prior acetabular labral injury or other degenerative change. IMPRESSION: 1. 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 Resulting upstream dilatation of the small bowel with distal decompression of the colon. 2. Twisting about the mesenteric pedicle with swirling of the mesenteric vessels and mild mesenteric congestion as well. 3. Small volume fluid in the pelvis, likely reactive. 4. Mild bladder wall thickening and perivesicular hazy stranding. Recommend correlation with urinalysis to exclude the possibility of cystitis. 5. Aortic Atherosclerosis (ICD10-I70.0). These results were called by telephone at the time of interpretation on 04/04/2019 at 8:15 pm to provider [facility] , who verbally acknowledged these results. Electronically Signed   By: Lovena Le M.D.   On: 04/04/2019 20:16    Procedures Procedures (including critical care time)  Medications Ordered in ED Medications  sodium chloride 0.9 % bolus 1,000 mL (0 mLs Intravenous Stopped 04/04/19 1932)  ondansetron (ZOFRAN) injection 4 mg (4 mg Intravenous Given 04/04/19 1554)  fentaNYL (SUBLIMAZE) injection 50 mcg (50 mcg Intravenous Given 04/04/19 1853)  iohexol (OMNIPAQUE) 300 MG/ML solution 100 mL (100 mLs Intravenous Contrast Given 04/04/19 1945)    ED Course  I have reviewed the triage vital signs and the nursing notes.  Pertinent labs & imaging results that were available during my care of the patient were reviewed by me and considered in my medical  decision making (see chart for details).    MDM Rules/Calculators/A&P                      Patient presents to the emergency department for evaluation of nausea, vomiting, and diarrhea.  Recently having abdominal pain and urinary symptoms on Keflex.  Nontoxic, resting fairly comfortably on my assessment, initially elevated BP improved, doubt HTN emergency.  On initial exam abdomen is nontender without peritoneal signs.  We will plan to check labs, rehydrate, and give Zofran.  CBC: No leukocytosis or significant anemia CMP: No electrolyte derangement.  Renal function preserved.  LFTs WNL. Lipase: WNL Urinalysis:Ketonuria, proteinuria, trace leuks, not overly concerning for UTI. Pregnancy test: Negative  18:00: On reassessment patient states the abdominal pain has returned, she has lower abdominal tenderness.  Will give fentanyl and further assess with CT abdomen/pelvis.  CT A/P: Features of closed loop cecal volvulus with some edematous mural thickening and inflammatory changes worrisome for developing vascular compromise. Resulting upstream dilatation of the small bowel with distal decompression of the colon. Twisting about the mesenteric pedicle with swirling of the mesenteric vessels and mild mesenteric congestion as well. Small volume fluid in the pelvis, likely reactive--> findings were discussed with radiologist Dr. Elvera Maria COVID swab ordered, consult placed to general surgery   20:25: CONSULT: Discussed with general surgeon Dr. Carolynne Edouard- will evaluate patient.   21:30 Patient care transitioned to supervising physician Dr. Juleen China @ change of shift pending further recommendations from general surgery & disposition.   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

## 2019-04-04 NOTE — ED Triage Notes (Signed)
Patient reports she was being treated for a UTI by her gyn. On weds 04/02/19 gyn called and switched patient's antibiotic due to bacteria growth in urine culture. Patient states at 0245 this day, she became nauseated and began vomiting and has had several episodes of diarrhea. Unable to hold anything down. Patient reports weakness and says she is dehydrated. Denies covid exposure. Patient says gyn told her to come to ED to get checked out. BP in triage 201/141, patient endorsed history of HTN but did not take medications today due to vomiting.

## 2019-04-04 NOTE — H&P (Signed)
Tasha Bishop is an 49 y.o. female.   Chief Complaint: Abdominal pain HPI: The patient appears to have evidence of a cecal volvulus causing a partial small bowel obstruction.  She is still passing liquid stool.  She has a normal white count and normal vitals.  She has been experiencing abdominal pain for the last 8 days.  She was initially treated as a urinary tract infection but did not improve.  She denies any fevers or chills.  She has had some nausea and vomiting as well as diarrhea.  She came to the emergency department where a CT scan shows some evidence of cecal volvulus causing a small bowel obstruction.  Past Medical History:  Diagnosis Date  . Hypertension     Past Surgical History:  Procedure Laterality Date  . CESAREAN SECTION      History reviewed. No pertinent family history. Social History:  reports that she has never smoked. She has never used smokeless tobacco. She reports that she does not drink alcohol or use drugs.  Allergies:  Allergies  Allergen Reactions  . Percocet [Oxycodone-Acetaminophen] Itching    (Not in a hospital admission)   Results for orders placed or performed during the hospital encounter of 04/04/19 (from the past 48 hour(s))  Comprehensive metabolic panel     Status: Abnormal   Collection Time: 04/04/19  3:59 PM  Result Value Ref Range   Sodium 138 135 - 145 mmol/L   Potassium 3.8 3.5 - 5.1 mmol/L   Chloride 103 98 - 111 mmol/L   CO2 25 22 - 32 mmol/L   Glucose, Bld 112 (H) 70 - 99 mg/dL   BUN 15 6 - 20 mg/dL   Creatinine, Ser 4.09 0.44 - 1.00 mg/dL   Calcium 9.5 8.9 - 81.1 mg/dL   Total Protein 9.2 (H) 6.5 - 8.1 g/dL   Albumin 4.7 3.5 - 5.0 g/dL   AST 22 15 - 41 U/L   ALT 27 0 - 44 U/L   Alkaline Phosphatase 76 38 - 126 U/L   Total Bilirubin 1.1 0.3 - 1.2 mg/dL   GFR calc non Af Amer >60 >60 mL/min   GFR calc Af Amer >60 >60 mL/min   Anion gap 10 5 - 15    Comment: Performed at [facility], [address] [place], [place] [address]  CBC with Differential     Status: Abnormal   Collection Time: 04/04/19  3:59 PM  Result Value Ref Range   WBC 7.0 4.0 - 10.5 K/uL   RBC 5.26 (H) 3.87 - 5.11 MIL/uL   Hemoglobin 14.7 12.0 - 15.0 g/dL   HCT 29.5 (H) 62.1 - 30.8 %   MCV 88.0 80.0 - 100.0 fL   MCH 27.9 26.0 - 34.0 pg   MCHC 31.7 30.0 - 36.0 g/dL   RDW 65.7 84.6 - 96.2 %   Platelets 349 150 - 400 K/uL   nRBC 0.0 0.0 - 0.2 %   Neutrophils Relative % 74 %   Neutro Abs 5.1 1.7 - 7.7 K/uL   Lymphocytes Relative 22 %   Lymphs Abs 1.5 0.7 - 4.0 K/uL   Monocytes Relative 4 %   Monocytes Absolute 0.3 0.1 - 1.0 K/uL   Eosinophils Relative 0 %   Eosinophils Absolute 0.0 0.0 - 0.5 K/uL   Basophils Relative 0 %   Basophils Absolute 0.0 0.0 - 0.1 K/uL   Immature Granulocytes 0 %   Abs Immature Granulocytes 0.03 0.00 - 0.07 K/uL  Comment: Performed at [facility], [address] [place], [place] [address]  Lipase, blood     Status: None   Collection Time: 04/04/19  3:59 PM  Result Value Ref Range   Lipase 47 11 - 51 U/L    Comment: Performed at [facility], [address], [place], [place] [address]  Urinalysis, Routine w reflex microscopic     Status: Abnormal   Collection Time: 04/04/19  3:59 PM  Result Value Ref Range   Color, Urine BLUE (A) YELLOW    Comment: CORRECTED ON 02/05 AT 1703: PREVIOUSLY REPORTED AS YELLOW   APPearance TURBID (A) CLEAR   Specific Gravity, Urine 1.027 1.005 - 1.030   pH 5.0 5.0 - 8.0   Glucose, UA NEGATIVE NEGATIVE mg/dL   Hgb urine dipstick NEGATIVE NEGATIVE   Bilirubin Urine NEGATIVE NEGATIVE   Ketones, ur 20 (A) NEGATIVE mg/dL   Protein, ur 30 (A) NEGATIVE mg/dL   Nitrite NEGATIVE NEGATIVE   Leukocytes,Ua TRACE (A) NEGATIVE   Bacteria, UA NONE SEEN NONE SEEN    Comment: Performed at [facility], [address], [place], [place] [address]  I-Stat beta hCG blood, ED     Status: None   Collection  Time: 04/04/19  4:11 PM  Result Value Ref Range   I-stat hCG, quantitative <5.0 <5 mIU/mL   Comment 3            Comment:   GEST. AGE      CONC.  (mIU/mL)   <=1 WEEK        5 - 50     2 WEEKS       50 - 500     3 WEEKS       100 - 10,000     4 WEEKS     1,000 - 30,000        FEMALE AND NON-PREGNANT FEMALE:     LESS THAN 5 mIU/mL    CT Abdomen Pelvis W Contrast  Result Date: 04/04/2019 CLINICAL DATA:  Abdominal pain, worsening nausea and vomiting, recent treatment for UTI EXAM: CT ABDOMEN AND PELVIS WITH CONTRAST TECHNIQUE: Multidetector CT imaging of the abdomen and pelvis was performed using the standard protocol following bolus administration of intravenous contrast. CONTRAST:  OMNIPAQUE IOHEXOL 300 MG/ML  SOLN COMPARISON:  Abdominal ultrasound 06/07/2011 FINDINGS: Lower chest: Lung bases are clear. Normal heart size. No pericardial effusion. Hepatobiliary: No focal liver abnormality is seen. No gallstones, gallbladder wall thickening, or biliary dilatation. Pancreas: Mild compression of the pancreatic body and proximal tail by the distended, displaced cecum. No pancreatic ductal dilatation. Spleen: Normal in size without focal abnormality. Adrenals/Urinary Tract: Adrenal glands are unremarkable. Kidneys are normal, without renal calculi, focal lesion, or hydronephrosis. Mild bladder wall thickening and perivesicular hazy stranding, poorly assessed given poor distension of the bladder on this CT examination. Stomach/Bowel: Small hiatal hernia. Distal stomach and duodenum are unremarkable. There is air and fluid distention of the distal small bowel with a twisting closed loop obstruction involving the terminal ileum and proximal cecum which is displaced into the left upper quadrant. Paired transition points are noted in the mid abdomen (2/54). There is mild mural thickening and hyperemia of the distended cecum with adjacent inflammatory changes and likely reactive fluid tracking in the pericolic  gutters to the deep pelvis. Distal colon is decompressed. Vascular/Lymphatic: Atherosclerotic plaque within the normal caliber aorta. Twisting of the mesenteric vessels supplying the cecum and terminal ileum involves  with the process detailed above. Some mild mesenteric congestion is noted. Reproductive: Anteverted uterus. Bandlike hypoattenuation in the lower uterine segment likely reflecting postsurgical change related to prior Caesarean. Additional fluid attenuation cystic structures towards the cervix likely nabothian cysts. No concerning adnexal lesions. Other: Small volume free fluid in the pelvis, favor reactive. Inflammatory changes adjacent the distended cecal loop in the upper midline and left upper quadrant. No free air is seen. Mild posterior body wall edema. Musculoskeletal: No acute osseous abnormality or suspicious osseous lesion. Facet degenerative changes are noted in the spine at L4-5. Corticated crescentic calcification along the right superior acetabular rim, may reflect prior acetabular labral injury or other degenerative change. IMPRESSION: 1. 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 Resulting upstream dilatation of the small bowel with distal decompression of the colon. 2. Twisting about the mesenteric pedicle with swirling of the mesenteric vessels and mild mesenteric congestion as well. 3. Small volume fluid in the pelvis, likely reactive. 4. Mild bladder wall thickening and perivesicular hazy stranding. Recommend correlation with urinalysis to exclude the possibility of cystitis. 5. Aortic Atherosclerosis (ICD10-I70.0). These results were called by telephone at the time of interpretation on 04/04/2019 at 8:15 pm to provider [facility] , who verbally acknowledged these results. Electronically Signed   By: Lovena Le M.D.   On: 04/04/2019 20:16    Review of Systems   Constitutional: Negative.   HENT: Negative.   Eyes: Negative.   Respiratory: Negative.   Cardiovascular: Negative.   Gastrointestinal: Positive for abdominal distention, abdominal pain, diarrhea and vomiting.  Endocrine: Negative.   Genitourinary: Negative.   Musculoskeletal: Negative.   Skin: Negative.   Allergic/Immunologic: Negative.   Neurological: Negative.   Hematological: Negative.   Psychiatric/Behavioral: Negative.     Blood pressure (!) 161/90, pulse 79, temperature 98 F (36.7 C), temperature source Oral, resp. rate 20, height 5\' 5"  (1.651 m), weight 103 kg, SpO2 99 %. Physical Exam  Constitutional: She is oriented to person, place, and time.  Obese bf in no acute distress  HENT:  Right Ear: External ear normal.  Left Ear: External ear normal.  Nose: Nose normal.  Mouth/Throat: No oropharyngeal exudate.  Eyes: Pupils are equal, round, and reactive to light. Conjunctivae and EOM are normal. No scleral icterus.  Neck: No tracheal deviation present. No thyromegaly present.  Cardiovascular: Normal rate, regular rhythm, normal heart sounds and intact distal pulses.  No pitting edema lower extr  Respiratory: Effort normal and breath sounds normal. No respiratory distress.  GI: Soft.  There is moderate diffuse tenderness although the abdomen is very soft with no guarding or peritonitis. No obvious hernia  Musculoskeletal:        General: No deformity.     Cervical back: Normal range of motion and neck supple.     Comments: 5/5 strenth upper and lower extr. Lying in bed so gait not evaluated  Lymphadenopathy:    She has no cervical adenopathy.  No palpable groin or cervical lymphadenopathy  Neurological: She is alert and oriented to person, place, and time.  Skin: Skin is warm and dry. No rash noted.  Psychiatric: She has a normal mood and affect. Her behavior is normal. Judgment normal.     Assessment/Plan The patient appears to have a cecal volvulus causing at  least a partial small bowel obstruction.  At this point I would recommend admitted to the hospital and starting her on IV hydration  therapy as well as bowel rest.  We are currently waiting for her Covid test to come back.  If this were to come back positive then we may try to have gastroenterology decompress her.  If this test is negative and her pain does not improve overnight then she may require exploration in the morning.  Chevis Pretty III, MD 04/04/2019, 9:40 PM

## 2019-04-05 ENCOUNTER — Inpatient Hospital Stay (HOSPITAL_COMMUNITY): Payer: BC Managed Care – PPO

## 2019-04-05 ENCOUNTER — Encounter (HOSPITAL_COMMUNITY): Admission: EM | Disposition: A | Discharge status: Home Health | Payer: Self-pay | Source: Home / Self Care

## 2019-04-05 ENCOUNTER — Inpatient Hospital Stay (HOSPITAL_COMMUNITY): Payer: BC Managed Care – PPO | Admitting: Registered Nurse

## 2019-04-05 HISTORY — PX: LAPAROTOMY: SHX154

## 2019-04-05 LAB — CBC
HCT: 43.5 % (ref 36.0–46.0)
Hemoglobin: 13.9 g/dL (ref 12.0–15.0)
MCH: 28.1 pg (ref 26.0–34.0)
MCHC: 32 g/dL (ref 30.0–36.0)
MCV: 88.1 fL (ref 80.0–100.0)
Platelets: 321 10*3/uL (ref 150–400)
RBC: 4.94 MIL/uL (ref 3.87–5.11)
RDW: 14.2 % (ref 11.5–15.5)
WBC: 8.6 10*3/uL (ref 4.0–10.5)
nRBC: 0 % (ref 0.0–0.2)

## 2019-04-05 LAB — SURGICAL PCR SCREEN
MRSA, PCR: NEGATIVE
Staphylococcus aureus: NEGATIVE

## 2019-04-05 LAB — BASIC METABOLIC PANEL
Anion gap: 14 (ref 5–15)
BUN: 13 mg/dL (ref 6–20)
CO2: 21 mmol/L — ABNORMAL LOW (ref 22–32)
Calcium: 8.3 mg/dL — ABNORMAL LOW (ref 8.9–10.3)
Chloride: 101 mmol/L (ref 98–111)
Creatinine, Ser: 0.85 mg/dL (ref 0.44–1.00)
GFR calc Af Amer: >60 mL/min (ref >60)
GFR calc non Af Amer: >60 mL/min (ref >60)
Glucose, Bld: 137 mg/dL — ABNORMAL HIGH (ref 70–99)
Potassium: 3.7 mmol/L (ref 3.5–5.1)
Sodium: 136 mmol/L (ref 135–145)

## 2019-04-05 LAB — URINE CULTURE: Culture: NO GROWTH

## 2019-04-05 LAB — HIV ANTIBODY (ROUTINE TESTING W REFLEX): HIV Screen 4th Generation wRfx: NONREACTIVE

## 2019-04-05 SURGERY — LAPAROTOMY, EXPLORATORY
Anesthesia: General | Site: Abdomen

## 2019-04-05 MED ORDER — PROPOFOL 10 MG/ML IV BOLUS
INTRAVENOUS | Status: AC
  Filled 2019-04-05: qty 20

## 2019-04-05 MED ORDER — LIDOCAINE 2% (20 MG/ML) 5 ML SYRINGE
INTRAMUSCULAR | Status: DC | PRN
  Administered 2019-04-05: 1.5 mg/kg/h via INTRAVENOUS

## 2019-04-05 MED ORDER — SODIUM CHLORIDE 0.9% FLUSH: 9.0000 mL | INTRAVENOUS | Status: DC | PRN

## 2019-04-05 MED ORDER — SUCCINYLCHOLINE CHLORIDE 200 MG/10ML IV SOSY
PREFILLED_SYRINGE | INTRAVENOUS | Status: AC
  Filled 2019-04-05: qty 10

## 2019-04-05 MED ORDER — DIPHENHYDRAMINE HCL 12.5 MG/5ML PO ELIX: 12.5000 mg | ORAL_SOLUTION | Freq: Four times a day (QID) | ORAL | Status: DC | PRN

## 2019-04-05 MED ORDER — NALOXONE HCL 0.4 MG/ML IJ SOLN: 0.4000 mg | INTRAMUSCULAR | Status: DC | PRN

## 2019-04-05 MED ORDER — SODIUM CHLORIDE 0.9 % IR SOLN
Status: DC | PRN
  Administered 2019-04-05: 4000 mL

## 2019-04-05 MED ORDER — DEXAMETHASONE SODIUM PHOSPHATE 10 MG/ML IJ SOLN
INTRAMUSCULAR | Status: AC
  Filled 2019-04-05: qty 1

## 2019-04-05 MED ORDER — DEXAMETHASONE SODIUM PHOSPHATE 10 MG/ML IJ SOLN
INTRAMUSCULAR | Status: DC | PRN
  Administered 2019-04-05: 8 mg via INTRAVENOUS

## 2019-04-05 MED ORDER — ROCURONIUM BROMIDE 10 MG/ML (PF) SYRINGE
PREFILLED_SYRINGE | INTRAVENOUS | Status: DC | PRN
  Administered 2019-04-05: 50 mg via INTRAVENOUS

## 2019-04-05 MED ORDER — HYDROMORPHONE HCL 1 MG/ML IJ SOLN
INTRAMUSCULAR | Status: AC
  Filled 2019-04-05: qty 1

## 2019-04-05 MED ORDER — HYDROMORPHONE 1 MG/ML IV SOLN
INTRAVENOUS | Status: DC
  Administered 2019-04-05: 30 mg via INTRAVENOUS
  Administered 2019-04-06: 0.9 mg via INTRAVENOUS
  Administered 2019-04-06: 0.2 mg via INTRAVENOUS
  Administered 2019-04-06: 0.6 mg via INTRAVENOUS
  Administered 2019-04-06: 1.2 mg via INTRAVENOUS
  Administered 2019-04-06 – 2019-04-07 (×2): 0.4 mg via INTRAVENOUS
  Administered 2019-04-07: 0.2 mg via INTRAVENOUS
  Administered 2019-04-07: 0 mg via INTRAVENOUS
  Filled 2019-04-05: qty 30

## 2019-04-05 MED ORDER — MUPIROCIN 2 % EX OINT
1.0000 "application " | TOPICAL_OINTMENT | Freq: Two times a day (BID) | CUTANEOUS | Status: DC
  Administered 2019-04-06 – 2019-04-07 (×3): 1 via NASAL
  Filled 2019-04-05: qty 22

## 2019-04-05 MED ORDER — PROPOFOL 10 MG/ML IV BOLUS
INTRAVENOUS | Status: DC | PRN
  Administered 2019-04-05: 200 mg via INTRAVENOUS

## 2019-04-05 MED ORDER — SCOPOLAMINE 1 MG/3DAYS TD PT72
MEDICATED_PATCH | TRANSDERMAL | Status: AC
  Filled 2019-04-05: qty 1

## 2019-04-05 MED ORDER — HYDROMORPHONE HCL 2 MG/ML IJ SOLN
INTRAMUSCULAR | Status: AC
  Filled 2019-04-05: qty 1

## 2019-04-05 MED ORDER — DIPHENHYDRAMINE HCL 50 MG/ML IJ SOLN: 12.5000 mg | Freq: Four times a day (QID) | INTRAMUSCULAR | Status: DC | PRN

## 2019-04-05 MED ORDER — LIDOCAINE 2% (20 MG/ML) 5 ML SYRINGE
INTRAMUSCULAR | Status: DC | PRN
  Administered 2019-04-05: 100 mg via INTRAVENOUS

## 2019-04-05 MED ORDER — ONDANSETRON HCL 4 MG/2ML IJ SOLN
INTRAMUSCULAR | Status: AC
  Filled 2019-04-05: qty 2

## 2019-04-05 MED ORDER — HYDROMORPHONE HCL 1 MG/ML IJ SOLN
0.2500 mg | INTRAMUSCULAR | Status: DC | PRN
  Administered 2019-04-05 (×4): 0.5 mg via INTRAVENOUS

## 2019-04-05 MED ORDER — ROCURONIUM BROMIDE 10 MG/ML (PF) SYRINGE
PREFILLED_SYRINGE | INTRAVENOUS | Status: AC
  Filled 2019-04-05: qty 10

## 2019-04-05 MED ORDER — MIDAZOLAM HCL 5 MG/5ML IJ SOLN
INTRAMUSCULAR | Status: DC | PRN
  Administered 2019-04-05: 1 mg via INTRAVENOUS

## 2019-04-05 MED ORDER — SUGAMMADEX SODIUM 500 MG/5ML IV SOLN
INTRAVENOUS | Status: DC | PRN
  Administered 2019-04-05: 250 mg via INTRAVENOUS

## 2019-04-05 MED ORDER — ONDANSETRON HCL 4 MG/2ML IJ SOLN: 4.0000 mg | Freq: Four times a day (QID) | INTRAMUSCULAR | Status: DC | PRN

## 2019-04-05 MED ORDER — CHLORHEXIDINE GLUCONATE CLOTH 2 % EX PADS
6.0000 | MEDICATED_PAD | Freq: Every day | CUTANEOUS | Status: DC
  Administered 2019-04-07 – 2019-04-09 (×3): 6 via TOPICAL

## 2019-04-05 MED ORDER — SODIUM CHLORIDE 0.9 % IV SOLN
INTRAVENOUS | Status: AC
  Filled 2019-04-05: qty 20

## 2019-04-05 MED ORDER — SUCCINYLCHOLINE CHLORIDE 200 MG/10ML IV SOSY
PREFILLED_SYRINGE | INTRAVENOUS | Status: DC | PRN
  Administered 2019-04-05: 140 mg via INTRAVENOUS

## 2019-04-05 MED ORDER — CHLORHEXIDINE GLUCONATE CLOTH 2 % EX PADS
6.0000 | MEDICATED_PAD | Freq: Every day | CUTANEOUS | Status: DC
  Administered 2019-04-05: 6 via TOPICAL

## 2019-04-05 MED ORDER — DEXTROSE 5 % IV SOLN
INTRAVENOUS | Status: DC | PRN
  Administered 2019-04-05: 08:00:00 2 g via INTRAVENOUS

## 2019-04-05 MED ORDER — LACTATED RINGERS IV SOLN: INTRAVENOUS | Status: DC | PRN

## 2019-04-05 MED ORDER — PROMETHAZINE HCL 25 MG/ML IJ SOLN: 6.2500 mg | INTRAMUSCULAR | Status: DC | PRN

## 2019-04-05 MED ORDER — FENTANYL CITRATE (PF) 100 MCG/2ML IJ SOLN
INTRAMUSCULAR | Status: DC | PRN
  Administered 2019-04-05: 50 ug via INTRAVENOUS
  Administered 2019-04-05: 100 ug via INTRAVENOUS
  Administered 2019-04-05 (×2): 50 ug via INTRAVENOUS

## 2019-04-05 MED ORDER — HYDROMORPHONE HCL 1 MG/ML IJ SOLN
INTRAMUSCULAR | Status: DC | PRN
  Administered 2019-04-05 (×3): .5 mg via INTRAVENOUS

## 2019-04-05 MED ORDER — ACETAMINOPHEN 10 MG/ML IV SOLN
1000.0000 mg | Freq: Once | INTRAVENOUS | Status: DC | PRN
  Administered 2019-04-05: 1000 mg via INTRAVENOUS

## 2019-04-05 MED ORDER — SCOPOLAMINE 1 MG/3DAYS TD PT72
MEDICATED_PATCH | TRANSDERMAL | Status: DC | PRN
  Administered 2019-04-05: 1 via TRANSDERMAL

## 2019-04-05 MED ORDER — FENTANYL CITRATE (PF) 250 MCG/5ML IJ SOLN
INTRAMUSCULAR | Status: AC
  Filled 2019-04-05: qty 5

## 2019-04-05 MED ORDER — ACETAMINOPHEN 10 MG/ML IV SOLN
INTRAVENOUS | Status: AC
  Filled 2019-04-05: qty 100

## 2019-04-05 MED ORDER — MIDAZOLAM HCL 2 MG/2ML IJ SOLN
INTRAMUSCULAR | Status: AC
  Filled 2019-04-05: qty 2

## 2019-04-05 MED ORDER — LIDOCAINE HCL 2 % IJ SOLN
INTRAMUSCULAR | Status: AC
  Filled 2019-04-05: qty 20

## 2019-04-05 MED ORDER — ONDANSETRON HCL 4 MG/2ML IJ SOLN
INTRAMUSCULAR | Status: DC | PRN
  Administered 2019-04-05: 4 mg via INTRAVENOUS

## 2019-04-05 SURGICAL SUPPLY — 41 items
APPLICATOR COTTON TIP 6 STRL (MISCELLANEOUS) IMPLANT
APPLICATOR COTTON TIP 6IN STRL (MISCELLANEOUS)
BLADE EXTENDED COATED 6.5IN (ELECTRODE) IMPLANT
BLADE HEX COATED 2.75 (ELECTRODE) ×3 IMPLANT
COVER MAYO STAND STRL (DRAPES) ×3 IMPLANT
COVER WAND RF STERILE (DRAPES) IMPLANT
DRAPE LAPAROSCOPIC ABDOMINAL (DRAPES) IMPLANT
DRAPE WARM FLUID 44X44 (DRAPES) ×3 IMPLANT
DRSG OPSITE POSTOP 4X10 (GAUZE/BANDAGES/DRESSINGS) ×3 IMPLANT
ELECT REM PT RETURN 15FT ADLT (MISCELLANEOUS) ×3 IMPLANT
GAUZE SPONGE 4X4 12PLY STRL (GAUZE/BANDAGES/DRESSINGS) ×3 IMPLANT
GLOVE BIO SURGEON STRL SZ7.5 (GLOVE) ×6 IMPLANT
GLOVE BIOGEL PI IND STRL 7.0 (GLOVE) ×1 IMPLANT
GLOVE BIOGEL PI INDICATOR 7.0 (GLOVE) ×2
GOWN STRL REUS W/ TWL XL LVL3 (GOWN DISPOSABLE) ×1 IMPLANT
GOWN STRL REUS W/TWL LRG LVL3 (GOWN DISPOSABLE) ×3 IMPLANT
GOWN STRL REUS W/TWL XL LVL3 (GOWN DISPOSABLE) ×5 IMPLANT
HANDLE SUCTION POOLE (INSTRUMENTS) ×1 IMPLANT
KIT BASIN OR (CUSTOM PROCEDURE TRAY) ×3 IMPLANT
KIT TURNOVER KIT A (KITS) IMPLANT
LIGASURE IMPACT 36 18CM CVD LR (INSTRUMENTS) ×3 IMPLANT
NS IRRIG 1000ML POUR BTL (IV SOLUTION) ×3 IMPLANT
PACK GENERAL/GYN (CUSTOM PROCEDURE TRAY) ×3 IMPLANT
PENCIL SMOKE EVACUATOR (MISCELLANEOUS) ×3 IMPLANT
RELOAD PROXIMATE 75MM BLUE (ENDOMECHANICALS) ×6 IMPLANT
SPONGE LAP 18X18 RF (DISPOSABLE) IMPLANT
STAPLER GUN LINEAR PROX 60 (STAPLE) ×3 IMPLANT
STAPLER PROXIMATE 75MM BLUE (STAPLE) ×3 IMPLANT
STAPLER VISISTAT 35W (STAPLE) ×3 IMPLANT
SUCTION POOLE HANDLE (INSTRUMENTS) ×3
SUT PDS AB 1 CTX 36 (SUTURE) IMPLANT
SUT SILK 2 0 (SUTURE) ×2
SUT SILK 2 0 SH CR/8 (SUTURE) ×3 IMPLANT
SUT SILK 2-0 18XBRD TIE 12 (SUTURE) ×1 IMPLANT
SUT SILK 3 0 (SUTURE)
SUT SILK 3 0 SH CR/8 (SUTURE) ×3 IMPLANT
SUT SILK 3-0 18XBRD TIE 12 (SUTURE) IMPLANT
TOWEL OR 17X26 10 PK STRL BLUE (TOWEL DISPOSABLE) ×3 IMPLANT
TRAY FOLEY MTR SLVR 16FR STAT (SET/KITS/TRAYS/PACK) IMPLANT
WATER STERILE IRR 1000ML POUR (IV SOLUTION) ×3 IMPLANT
YANKAUER SUCT BULB TIP NO VENT (SUCTIONS) ×3 IMPLANT

## 2019-04-05 NOTE — Progress Notes (Signed)
Dr. Carolynne Edouard notified that no N/G Tube inserted post op. Stated "that is fine." Call placed to Niel Hummer, RN and advised that Dr. Carolynne Edouard aware and no further orders received.

## 2019-04-05 NOTE — Transfer of Care (Signed)
Immediate Anesthesia Transfer of Care Note  Patient: Tasha Bishop  Procedure(s) Performed: EXPLORATORY LAPAROTOMY, right colectomy incidental appendectomy lysis of adhesions (N/A Abdomen)  Patient Location: PACU  Anesthesia Type:General  Level of Consciousness: awake, alert , oriented and patient cooperative  Airway & Oxygen Therapy: Patient Spontanous Breathing and Patient connected to face mask oxygen  Post-op Assessment: Report given to RN, Post -op Vital signs reviewed and stable and Patient moving all extremities  Post vital signs: Reviewed and stable  Last Vitals:  Vitals Value Taken Time  BP 135/99 04/05/19 0940  Temp    Pulse 95 04/05/19 0943  Resp 25 04/05/19 0943  SpO2 100 % 04/05/19 0943  Vitals shown include unvalidated device data.  Last Pain:  Vitals:   04/05/19 0500  TempSrc:   PainSc: 8          Complications: No apparent anesthesia complications

## 2019-04-05 NOTE — Anesthesia Postprocedure Evaluation (Signed)
Anesthesia Post Note  Patient: Tasha Bishop  Procedure(s) Performed: EXPLORATORY LAPAROTOMY, right colectomy incidental appendectomy lysis of adhesions (N/A Abdomen)     Patient location during evaluation: PACU Anesthesia Type: General Level of consciousness: awake and alert and oriented Pain management: pain level controlled Vital Signs Assessment: post-procedure vital signs reviewed and stable Respiratory status: spontaneous breathing, nonlabored ventilation and respiratory function stable Cardiovascular status: blood pressure returned to baseline Postop Assessment: no apparent nausea or vomiting Anesthetic complications: no               Kaylyn Layer

## 2019-04-05 NOTE — Anesthesia Preprocedure Evaluation (Addendum)
Anesthesia Evaluation  Patient identified by MRN, date of birth, ID band Patient awake    Reviewed: Allergy & Precautions, NPO status , Patient's Chart, lab work & pertinent test results  History of Anesthesia Complications Negative for: history of anesthetic complications  Airway Mallampati: II  TM Distance: >3 FB Neck ROM: Full    Dental no notable dental hx.    Pulmonary neg pulmonary ROS,    Pulmonary exam normal        Cardiovascular hypertension, Pt. 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  Reproductive/Obstetrics negative OB ROS                            Anesthesia Physical Anesthesia Plan  ASA: II and emergent  Anesthesia Plan: General   Post-op Pain Management:    Induction: Intravenous, Rapid sequence and Cricoid pressure planned  PONV Risk Score and Plan: 4 or greater and Treatment may vary due to age or medical condition, Ondansetron, Dexamethasone, Midazolam and Scopolamine patch - Pre-op  Airway Management Planned: Oral ETT  Additional Equipment: None  Intra-op Plan:   Post-operative Plan: Extubation in OR  Informed Consent: I have reviewed the patients History and Physical, chart, labs and discussed the procedure including the risks, benefits and alternatives for the proposed anesthesia with the patient or authorized representative who has indicated his/her understanding and acceptance.     Dental advisory given  Plan Discussed with: CRNA  Anesthesia Plan Comments:         Anesthesia Quick Evaluation

## 2019-04-05 NOTE — Progress Notes (Signed)
Pt has returned to Room 1517 from PACU, alert and oriented.

## 2019-04-05 NOTE — Progress Notes (Signed)
Procedural Consent signed and placed in patient chart

## 2019-04-05 NOTE — Progress Notes (Addendum)
Subjective/Chief Complaint: Feels much better than yesterday. No more vomiting or diarrhea   Objective: Vital signs in last 24 hours: Temp:  [98 F (36.7 C)-99.1 F (37.3 C)] 99.1 F (37.3 C) (02/06 0432) Pulse Rate:  [74-102] 93 (02/06 0432) Resp:  [16-20] 18 (02/06 0432) BP: (139-201)/(73-141) 148/73 (02/06 0432) SpO2:  [98 %-100 %] 99 % (02/06 0432) Weight:  [546 kg] 103 kg (02/05 1330) Last BM Date: 04/04/19  Intake/Output from previous day: 02/05 0701 - 02/06 0700 In: 1000 [I.V.:1000] Out: -  Intake/Output this shift: Total I/O In: 1000 [I.V.:1000] Out: -   General appearance: alert and cooperative Resp: clear to auscultation bilaterally Cardio: regular rate and rhythm GI: very soft and flat with minimal tenderness. no guarding  Lab Results:  Recent Labs    04/04/19 1559  WBC 7.0  HGB 14.7  HCT 46.3*  PLT 349   BMET Recent Labs    04/04/19 1559  NA 138  K 3.8  CL 103  CO2 25  GLUCOSE 112*  BUN 15  CREATININE 0.96  CALCIUM 9.5   PT/INR No results for input(s): LABPROT, INR in the last 72 hours. ABG No results for input(s): PHART, HCO3 in the last 72 hours.  Invalid input(s): PCO2, PO2  Studies/Results: CT Abdomen Pelvis W Contrast  Result Date: 04/04/2019 CLINICAL DATA:  Abdominal pain, worsening nausea and vomiting, recent treatment for UTI EXAM: CT ABDOMEN AND PELVIS WITH CONTRAST TECHNIQUE: Multidetector CT imaging of the abdomen and pelvis was performed using the standard protocol following bolus administration of intravenous contrast. CONTRAST:  11mL OMNIPAQUE IOHEXOL 300 MG/ML  SOLN COMPARISON:  Abdominal ultrasound 06/07/2011 FINDINGS: Lower chest: Lung bases are clear. Normal heart size. No pericardial effusion. Hepatobiliary: No focal liver abnormality is seen. No gallstones, gallbladder wall thickening, or biliary dilatation. Pancreas: Mild compression of the pancreatic body and proximal tail by the distended, displaced cecum. No  pancreatic ductal dilatation. Spleen: Normal in size without focal abnormality. Adrenals/Urinary Tract: Adrenal glands are unremarkable. Kidneys are normal, without renal calculi, focal lesion, or hydronephrosis. Mild bladder wall thickening and perivesicular hazy stranding, poorly assessed given poor distension of the bladder on this CT examination. Stomach/Bowel: Small hiatal hernia. Distal stomach and duodenum are unremarkable. There is air and fluid distention of the distal small bowel with a twisting closed loop obstruction involving the terminal ileum and proximal cecum which is displaced into the left upper quadrant. Paired transition points are noted in the mid abdomen (2/54). There is mild mural thickening and hyperemia of the distended cecum with adjacent inflammatory changes and likely reactive fluid tracking in the pericolic gutters to the deep pelvis. Distal colon is decompressed. Vascular/Lymphatic: Atherosclerotic plaque within the normal caliber aorta. Twisting of the mesenteric vessels supplying the cecum and terminal ileum involves with the process detailed above. Some mild mesenteric congestion is noted. Reproductive: Anteverted uterus. Bandlike hypoattenuation in the lower uterine segment likely reflecting postsurgical change related to prior Caesarean. Additional fluid attenuation cystic structures towards the cervix likely nabothian cysts. No concerning adnexal lesions. Other: Small volume free fluid in the pelvis, favor reactive. Inflammatory changes adjacent the distended cecal loop in the upper midline and left upper quadrant. No free air is seen. Mild posterior body wall edema. Musculoskeletal: No acute osseous abnormality or suspicious osseous lesion. Facet degenerative changes are noted in the spine at L4-5. Corticated crescentic calcification along the right superior acetabular rim, may reflect prior acetabular labral injury or other degenerative change. IMPRESSION: 1. Features of closed  loop cecal volvulus with some edematous mural thickening and inflammatory changes worrisome for developing vascular compromise. Resulting upstream dilatation of the small bowel with distal decompression of the colon. 2. Twisting about the mesenteric pedicle with swirling of the mesenteric vessels and mild mesenteric congestion as well. 3. Small volume fluid in the pelvis, likely reactive. 4. Mild bladder wall thickening and perivesicular hazy stranding. Recommend correlation with urinalysis to exclude the possibility of cystitis. 5. Aortic Atherosclerosis (ICD10-I70.0). These results were called by telephone at the time of interpretation on 04/04/2019 at 8:15 pm to provider [facility] , who verbally acknowledged these results. Electronically Signed   By: Kreg Shropshire M.D.   On: 04/04/2019 20:16    Anti-infectives: Anti-infectives (From admission, onward)   None      Assessment/Plan: s/p Procedure(s): EXPLORATORY LAPAROTOMY, POSSIBLE PARTIAL COLECTOMY, POSSIBLE ILEOSTOMY (N/A) will recheck abd xray this am. If there is still sign of cecal volvulus then will plan for exploration. Clinically she looks much improved  LOS: 1 day    Chevis Pretty III 04/05/2019

## 2019-04-05 NOTE — Op Note (Signed)
04/04/2019 - 04/05/2019  9:25 AM  PATIENT:  Tasha Bishop  49 y.o. female  PRE-OPERATIVE DIAGNOSIS:  Cecal Volvulus  POST-OPERATIVE DIAGNOSIS:  cecal volvulus  PROCEDURE:  Procedure(s): EXPLORATORY LAPAROTOMY, right colectomy, incidental appendectomy, lysis of adhesions (N/A)  SURGEON:  Surgeon(s) and Role:    Griselda Miner, MD - Primary  PHYSICIAN ASSISTANT:   ASSISTANTS: none   ANESTHESIA:   general  EBL:  25 mL   BLOOD ADMINISTERED:none  DRAINS: none   LOCAL MEDICATIONS USED:  NONE  SPECIMEN:  Source of Specimen:  right colon and terminal ileum  DISPOSITION OF SPECIMEN:  PATHOLOGY  COUNTS:  YES  TOURNIQUET:  * No tourniquets in log *  DICTATION: .Dragon Dictation   After informed consent was obtained the patient was brought to the operating room and placed in the supine position on the operating table.  After adequate induction of general anesthesia the patient's abdomen was prepped with ChloraPrep, allowed to dry, and draped in usual sterile manner.  An appropriate timeout was performed.  A midline incision was then made with a 10 blade knife.  The incision was carried through the skin and subcutaneous tissue sharply with the electrocautery until the linea alba was identified.  The linea alba was incised with the cautery.  The preperitoneal space was probed bluntly with a hemostat until the peritoneum was opened and access was gained to the abdominal cavity.  The rest of the incision was opened under direct vision.  The omentum was tethered to the lower midline of the abdominal wall.  This was taken down sharply with the electrocautery as well as with the LigaSure.  Once this was accomplished we were able to then reflect the omentum up and run the small bowel from the ligament of Treitz to the ileocecal valve.  The right colon had definitely volvulized but was not necrotic.  We were able to untwisted but the mesentery to the right colon was very foreshortened.  A site  was then chosen on the terminal ileum to divide the small bowel.  The mesentery at this point was opened sharply with the electrocautery.  A GIA-75 stapler was placed across the small bowel at this point, clamped, and fired thereby dividing the small bowel between staple lines.  The right colon was then mobilized by incising its retroperitoneal attachment along the white line of Toldt.  I was able to then mobilize the hepatic flexure up as well with the same mobilization.  I was then able to identify the main blood supply to the transverse colon.  At the hepatic flexure I chose a site for division of the colon.  The mesentery at this point was opened sharply with the electrocautery.  A GIA-75 stapler was placed across the colon at this point, clamped, and fired thereby dividing the colon between staple lines.  Next the mesentery to the right colon was taken down sharply with the LigaSure.  The main vessels were clamped with a Kelly clamp, divided, and ligated with 2-0 silk ties.  Once this was accomplished the terminal ileum and right colon that had volvulized with the appendix was removed from the patient and sent to pathology for further evaluation.  The wound was irrigated with copious amounts of saline.  The transverse colon and distal small bowel reached each other easily without tension.  The abdominal wall was covered with towels.  A small opening was then made on the antimesenteric side of the proximal transverse colon and distal small  bowel.  Each limb of a GIA-75 stapler was then placed down the appropriate limb of small bowel or colon, clamped, and fired thereby creating a nice widely patent enteroenterostomy.  The common opening was then closed with a TA 60 stapler.  The staple line was then reinforced with interrupted 2-0 silk Lembert stitches.  A 2-0 silk crotch stitch was placed.  The mesentery was then closed with 2-0 silk figure-of-eight stitches.  Care was taken to identify and avoid the duodenum  and closing the mesentery.  The anastomosis was then allowed to lay nicely in the right upper quadrant.  The abdomen was irrigated with copious amounts of saline.  The abdomen was generally inspected and no other abnormalities were noted.  Next all gowns gloves and drapes were exchanged for new.  The fascia of the anterior abdominal wall was then closed with 2 running #1 double-stranded looped PDS sutures.  The subcutaneous tissue was irrigated with copious amounts of saline.  The skin was closed with staples and sterile dressings were applied.  The patient tolerated the procedure well.  At the end of the case all needle sponge and instrument counts were correct.  The patient was then awakened and taken to recovery in stable condition.  PLAN OF CARE: Admit to inpatient   PATIENT DISPOSITION:  PACU - hemodynamically stable.   Delay start of Pharmacological VTE agent (>24hrs) due to surgical blood loss or risk of bleeding: no

## 2019-04-05 NOTE — Anesthesia Procedure Notes (Signed)
Procedure Name: Intubation Date/Time: 04/05/2019 7:33 AM Performed by: Victoriano Lain, CRNA Pre-anesthesia Checklist: Patient identified, Emergency Drugs available, Suction available, Patient being monitored and Timeout performed Patient Re-evaluated:Patient Re-evaluated prior to induction Oxygen Delivery Method: Circle system utilized Preoxygenation: Pre-oxygenation with 100% oxygen Induction Type: IV induction, Rapid sequence and Cricoid Pressure applied Laryngoscope Size: Mac and 4 Grade View: Grade I Tube type: Oral Tube size: 7.5 mm Number of attempts: 1 Airway Equipment and Method: Stylet Placement Confirmation: ETT inserted through vocal cords under direct vision,  positive ETCO2 and breath sounds checked- equal and bilateral Secured at: 21 cm Tube secured with: Tape Dental Injury: Teeth and Oropharynx as per pre-operative assessment  Comments: RSI with cricoid pressure by Dr Daiva Huge

## 2019-04-06 MED ORDER — LIP MEDEX EX OINT
TOPICAL_OINTMENT | CUTANEOUS | Status: DC | PRN
  Filled 2019-04-06: qty 7

## 2019-04-06 NOTE — Progress Notes (Signed)
1 Day Post-Op   Subjective/Chief Complaint: Had a lot of pain yesterday but seems to be better with pca   Objective: Vital signs in last 24 hours: Temp:  [97.5 F (36.4 C)-98.8 F (37.1 C)] 98.8 F (37.1 C) (02/07 0749) Pulse Rate:  [64-93] 88 (02/07 0749) Resp:  [15-19] 17 (02/07 0749) BP: (135-175)/(71-110) 135/77 (02/07 0749) SpO2:  [97 %-100 %] 100 % (02/07 0749) Last BM Date: 04/04/19  Intake/Output from previous day: 02/06 0701 - 02/07 0700 In: 3020 [I.V.:3020] Out: 1575 [Urine:1550; Blood:25] Intake/Output this shift: No intake/output data recorded.  General appearance: alert and cooperative Resp: clear to auscultation bilaterally Cardio: regular rate and rhythm GI: soft, appropriately tender. incision looks good  Lab Results:  Recent Labs    04/04/19 1559 04/05/19 0648  WBC 7.0 8.6  HGB 14.7 13.9  HCT 46.3* 43.5  PLT 349 321   BMET Recent Labs    04/04/19 1559 04/05/19 0648  NA 138 136  K 3.8 3.7  CL 103 101  CO2 25 21*  GLUCOSE 112* 137*  BUN 15 13  CREATININE 0.96 0.85  CALCIUM 9.5 8.3*   PT/INR No results for input(s): LABPROT, INR in the last 72 hours. ABG No results for input(s): PHART, HCO3 in the last 72 hours.  Invalid input(s): PCO2, PO2  Studies/Results: CT Abdomen Pelvis W Contrast  Result Date: 04/04/2019 CLINICAL DATA:  Abdominal pain, worsening nausea and vomiting, recent treatment for UTI EXAM: CT ABDOMEN AND PELVIS WITH CONTRAST TECHNIQUE: Multidetector CT imaging of the abdomen and pelvis was performed using the standard protocol following bolus administration of intravenous contrast. CONTRAST:  OMNIPAQUE IOHEXOL 300 MG/ML  SOLN COMPARISON:  Abdominal ultrasound 06/07/2011 FINDINGS: Lower chest: Lung bases are clear. Normal heart size. No pericardial effusion. Hepatobiliary: No focal liver abnormality is seen. No gallstones, gallbladder wall thickening, or biliary dilatation. Pancreas: Mild compression of the pancreatic  body and proximal tail by the distended, displaced cecum. No pancreatic ductal dilatation. Spleen: Normal in size without focal abnormality. Adrenals/Urinary Tract: Adrenal glands are unremarkable. Kidneys are normal, without renal calculi, focal lesion, or hydronephrosis. Mild bladder wall thickening and perivesicular hazy stranding, poorly assessed given poor distension of the bladder on this CT examination. Stomach/Bowel: Small hiatal hernia. Distal stomach and duodenum are unremarkable. There is air and fluid distention of the distal small bowel with a twisting closed loop obstruction involving the terminal ileum and proximal cecum which is displaced into the left upper quadrant. Paired transition points are noted in the mid abdomen (2/54). There is mild mural thickening and hyperemia of the distended cecum with adjacent inflammatory changes and likely reactive fluid tracking in the pericolic gutters to the deep pelvis. Distal colon is decompressed. Vascular/Lymphatic: Atherosclerotic plaque within the normal caliber aorta. Twisting of the mesenteric vessels supplying the cecum and terminal ileum involves with the process detailed above. Some mild mesenteric congestion is noted. Reproductive: Anteverted uterus. Bandlike hypoattenuation in the lower uterine segment likely reflecting postsurgical change related to prior Caesarean. Additional fluid attenuation cystic structures towards the cervix likely nabothian cysts. No concerning adnexal lesions. Other: Small volume free fluid in the pelvis, favor reactive. Inflammatory changes adjacent the distended cecal loop in the upper midline and left upper quadrant. No free air is seen. Mild posterior body wall edema. Musculoskeletal: No acute osseous abnormality or suspicious osseous lesion. Facet degenerative changes are noted in the spine at L4-5. Corticated crescentic calcification along the right superior acetabular rim, may reflect prior acetabular labral injury  or  other degenerative change. IMPRESSION: 1. 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 Resulting upstream dilatation of the small bowel with distal decompression of the colon. 2. Twisting about the mesenteric pedicle with swirling of the mesenteric vessels and mild mesenteric congestion as well. 3. Small volume fluid in the pelvis, likely reactive. 4. Mild bladder wall thickening and perivesicular hazy stranding. Recommend correlation with urinalysis to exclude the possibility of cystitis. 5. Aortic Atherosclerosis (ICD10-I70.0). These results were called by telephone at the time of interpretation on 04/04/2019 at 8:15 pm to provider [facility] , who verbally acknowledged these results. Electronically Signed   By: Lovena Le M.D.   On: 04/04/2019 20:16   DG Abd Portable 1V  Result Date: 04/05/2019 CLINICAL DATA:  Small-bowel obstruction. Abdominal pain. EXAM: PORTABLE ABDOMEN - 1 VIEW COMPARISON:  04/04/2019 CT FINDINGS: Mildly distended small bowel loops are again noted. A distended loop of bowel within the LEFT abdomen corresponds to the distended portion of colon/cecum on recent CT. No other significant abnormalities noted. IMPRESSION: Little significant change in distended small bowel and loop of colon as recently identified on CT. Electronically Signed   By: Margarette Canada M.D.   On: 04/05/2019 06:50    Anti-infectives: Anti-infectives (From admission, onward)   Start     Dose/Rate Route Frequency Ordered Stop   04/05/19 0654  sodium chloride 0.9 % with cefTRIAXone (ROCEPHIN) ADS Med    Note to Pharmacy: Carleene Cooper   : cabinet override      04/05/19 0654 04/05/19 1859      Assessment/Plan: s/p Procedure(s): EXPLORATORY LAPAROTOMY, right colectomy incidental appendectomy lysis of adhesions (N/A) Continue bowel rest  Continue foley POD 1 from right colectomy Subq heparin for vte  prophylaxis OOB to chair today  LOS: 2 days    Tasha Bishop 04/06/2019

## 2019-04-06 NOTE — Progress Notes (Signed)
This shift pt c/o pain not being managed with q1hour fentanyl. No new orders since arrived to unit from surgery. Text paged Dr. Carolynne Edouard, new order placed for Dilaudid PCA. Educated pt on PCA and incentive spirometer use. Pt reports pain better controlled. Will continue to monitor

## 2019-04-07 LAB — BASIC METABOLIC PANEL
Anion gap: 7 (ref 5–15)
BUN: 12 mg/dL (ref 6–20)
CO2: 25 mmol/L (ref 22–32)
Calcium: 8.5 mg/dL — ABNORMAL LOW (ref 8.9–10.3)
Chloride: 105 mmol/L (ref 98–111)
Creatinine, Ser: 0.8 mg/dL (ref 0.44–1.00)
GFR calc Af Amer: >60 mL/min (ref >60)
GFR calc non Af Amer: >60 mL/min (ref >60)
Glucose, Bld: 116 mg/dL — ABNORMAL HIGH (ref 70–99)
Potassium: 4.5 mmol/L (ref 3.5–5.1)
Sodium: 137 mmol/L (ref 135–145)

## 2019-04-07 LAB — CBC
HCT: 39.8 % (ref 36.0–46.0)
Hemoglobin: 12.5 g/dL (ref 12.0–15.0)
MCH: 28.2 pg (ref 26.0–34.0)
MCHC: 31.4 g/dL (ref 30.0–36.0)
MCV: 89.6 fL (ref 80.0–100.0)
Platelets: 261 10*3/uL (ref 150–400)
RBC: 4.44 MIL/uL (ref 3.87–5.11)
RDW: 14.6 % (ref 11.5–15.5)
WBC: 5 10*3/uL (ref 4.0–10.5)
nRBC: 0 % (ref 0.0–0.2)

## 2019-04-07 MED ORDER — HEPARIN SODIUM (PORCINE) 5000 UNIT/ML IJ SOLN
5000.0000 [IU] | Freq: Three times a day (TID) | INTRAMUSCULAR | Status: DC
  Administered 2019-04-07 – 2019-04-14 (×22): 5000 [IU] via SUBCUTANEOUS
  Filled 2019-04-07 (×23): qty 1

## 2019-04-07 MED ORDER — METHOCARBAMOL 1000 MG/10ML IJ SOLN
500.0000 mg | Freq: Three times a day (TID) | INTRAVENOUS | Status: DC
  Administered 2019-04-07 – 2019-04-08 (×4): 500 mg via INTRAVENOUS
  Filled 2019-04-07 (×4): qty 5
  Filled 2019-04-07 (×2): qty 500
  Filled 2019-04-07 (×2): qty 5

## 2019-04-07 MED ORDER — KETOROLAC TROMETHAMINE 15 MG/ML IJ SOLN
15.0000 mg | Freq: Four times a day (QID) | INTRAMUSCULAR | Status: DC
  Administered 2019-04-07 – 2019-04-12 (×19): 15 mg via INTRAVENOUS
  Filled 2019-04-07 (×19): qty 1

## 2019-04-07 MED ORDER — ONDANSETRON HCL 4 MG/2ML IJ SOLN
4.0000 mg | Freq: Once | INTRAMUSCULAR | Status: AC
  Administered 2019-04-07: 16:00:00 4 mg via INTRAVENOUS
  Filled 2019-04-07: qty 2

## 2019-04-07 MED ORDER — SODIUM CHLORIDE 0.9 % IV BOLUS
250.0000 mL | Freq: Once | INTRAVENOUS | Status: AC
  Administered 2019-04-07: 250 mL via INTRAVENOUS

## 2019-04-07 MED ORDER — METHOCARBAMOL 500 MG PO TABS
1000.0000 mg | ORAL_TABLET | Freq: Three times a day (TID) | ORAL | Status: DC
  Administered 2019-04-07: 1000 mg via ORAL
  Filled 2019-04-07: qty 2

## 2019-04-07 MED ORDER — HYDRALAZINE HCL 20 MG/ML IJ SOLN
10.0000 mg | Freq: Four times a day (QID) | INTRAMUSCULAR | Status: DC | PRN
  Administered 2019-04-07 – 2019-04-11 (×4): 10 mg via INTRAVENOUS
  Filled 2019-04-07 (×4): qty 1

## 2019-04-07 MED ORDER — FAMOTIDINE 20 MG PO TABS
20.0000 mg | ORAL_TABLET | Freq: Two times a day (BID) | ORAL | Status: DC
  Administered 2019-04-07 – 2019-04-09 (×5): 20 mg via ORAL
  Filled 2019-04-07 (×5): qty 1

## 2019-04-07 MED ORDER — PROMETHAZINE HCL 25 MG/ML IJ SOLN
6.2500 mg | Freq: Four times a day (QID) | INTRAMUSCULAR | Status: DC | PRN
  Administered 2019-04-07 – 2019-04-13 (×6): 6.25 mg via INTRAVENOUS
  Filled 2019-04-07 (×6): qty 1

## 2019-04-07 MED ORDER — ACETAMINOPHEN 500 MG PO TABS
1000.0000 mg | ORAL_TABLET | Freq: Three times a day (TID) | ORAL | Status: DC
  Administered 2019-04-07: 09:00:00 1000 mg via ORAL
  Filled 2019-04-07: qty 2

## 2019-04-07 MED ORDER — METOPROLOL TARTRATE 5 MG/5ML IV SOLN
INTRAVENOUS | Status: AC
  Administered 2019-04-07: 15:00:00 2.5 mg
  Filled 2019-04-07: qty 5

## 2019-04-07 MED ORDER — MORPHINE SULFATE (PF) 2 MG/ML IV SOLN
1.0000 mg | INTRAVENOUS | Status: DC | PRN
  Administered 2019-04-07 (×2): 1 mg via INTRAVENOUS
  Administered 2019-04-08 – 2019-04-12 (×4): 2 mg via INTRAVENOUS
  Filled 2019-04-07 (×6): qty 1

## 2019-04-07 MED ORDER — METOPROLOL TARTRATE 5 MG/5ML IV SOLN
2.5000 mg | Freq: Four times a day (QID) | INTRAVENOUS | Status: DC | PRN
  Administered 2019-04-07 – 2019-04-10 (×4): 2.5 mg via INTRAVENOUS
  Filled 2019-04-07 (×4): qty 5

## 2019-04-07 NOTE — Progress Notes (Signed)
RN arrived to the patient's room. Patient complaining of dizziness while laying in bed. PA called to the room. Patient's vital signs 163/126 BP, HR 78 SPO2 100 on 2L O2 RR 20. Verbal override ordered received to admister Metoprolol 2.5mg  IV. Rechecked BP in five mins 181/104. Hydralazine 10 mg given. Rechecked Bp 196/113.

## 2019-04-07 NOTE — Progress Notes (Signed)
   04/07/19 1445  MEWS Score  BP (!) 201/117  Pulse Rate 86  Resp 18  Level of Consciousness Alert  SpO2 100 %  O2 Device Nasal Cannula  MEWS Score  MEWS Temp 0  MEWS Systolic 2  MEWS Pulse 0  MEWS RR 0  MEWS LOC 0  MEWS Score 2  MEWS Score Color Yellow  MEWS Assessment  Is this an acute change? Yes  MEWS guidelines implemented *See Row Information* Yellow  Rapid Response Notification  Name of Rapid Response RN Notified Zoe Suggs  Date Rapid Response Notified 04/07/19  Time Rapid Response Notified 1455  Provider Notification  Provider Name/Title Zola Button, [place]  Date Provider Notified 04/07/19  Time Provider Notified 1426  Notification Type Face-to-face  Notification Reason Change in status  Response See new orders  Date of Provider Response 04/07/19  Time of Provider Response 1426

## 2019-04-07 NOTE — Progress Notes (Addendum)
2 Days Post-Op    CC: Abdominal pain  Subjective: Having a lot of pain.  Very uncomfortable, has not been out of bed Foley is still in.  It hurts to cough or move.  She is only moving about 700 on her incentive spirometry.  Early pain medication has been the Dilaudid PCA.  Objective: Vital signs in last 24 hours: Temp:  [98.2 F (36.8 C)-99.1 F (37.3 C)] 98.2 F (36.8 C) (02/08 0552) Pulse Rate:  [77-91] 77 (02/08 0552) Resp:  [12-19] 18 (02/08 0552) BP: (147-159)/(86-99) 157/94 (02/08 0552) SpO2:  [99 %-100 %] 100 % (02/08 0552) Last BM Date: 04/04/19 1250 IV 750 urine Afebrile blood pressure is slightly elevated On 2 L nasal cannula K+ 4.5, glucose 116, creatinine 0.8 WBC 5.0, H/H 12.5/39.8  Intake/Output from previous day: 02/07 0701 - 02/08 0700 In: 1250.8 [I.V.:1250.8] Out: 750 [Urine:750] Intake/Output this shift: No intake/output data recorded.  General appearance: alert, cooperative, no distress and Is a 1 and even turned to her side because of discomfort. Resp: clear to auscultation bilaterally and Anterior I could not get her to really move even to the side to do a posterior lung exam. Cardio: Regular rate and rhythm GI: Midline incision dressing is dry.  No bowel sounds, she is tender, no flatus or BM so far. Extremities: extremities normal, atraumatic, no cyanosis or edema  Lab Results:  Recent Labs    04/05/19 0648 04/07/19 0543  WBC 8.6 5.0  HGB 13.9 12.5  HCT 43.5 39.8  PLT 321 261    BMET Recent Labs    04/05/19 0648 04/07/19 0543  NA 136 137  K 3.7 4.5  CL 101 105  CO2 21* 25  GLUCOSE 137* 116*  BUN 13 12  CREATININE 0.85 0.80  CALCIUM 8.3* 8.5*   PT/INR No results for input(s): LABPROT, INR in the last 72 hours.  Recent Labs  Lab 04/04/19 1559  AST 22  ALT 27  ALKPHOS 76  BILITOT 1.1  PROT 9.2*  ALBUMIN 4.7     Lipase     Component Value Date/Time   LIPASE 47 04/04/2019 1559     Prior to Admission medications    Medication Sig Start Date End Date Taking? Authorizing Provider  amLODipine (NORVASC) 5 MG tablet Take 5 mg by mouth daily.   Yes [provider]  cephALEXin (KEFLEX) 500 MG capsule Take 500 mg by mouth 2 (two) times daily. 04/02/19  Yes [provider]  Meth-Hyo-M Bl-Na Phos-Ph Sal (URIBEL) 118 MG CAPS Take 1 capsule by mouth in the morning, at noon, in the evening, and at bedtime.   Yes [provider]  ranitidine (ZANTAC) 150 MG tablet Take 1 tablet (150 mg total) by mouth 2 (two) times daily. 06/07/11 06/06/12  Mabe, Latanya Maudlin, MD   . dextrose 5 % and 0.9 % NaCl with KCl 20 mEq/L 100 mL/hr at 04/07/19 0300   Medications: . Chlorhexidine Gluconate Cloth  6 each Topical Daily  . HYDROmorphone   Intravenous Q4H  . mupirocin ointment  1 application Nasal BID  . pantoprazole (PROTONIX) IV  40 mg Intravenous QHS   Antibiotics Given (last 72 hours)    None     Assessment/Plan Hypertension   Cecal volvulus with small bowel obstruction Exploratory laparotomy, right colectomy, incidental appendectomy, lysis of adhesions, 04/05/2019, Dr. Royston Sinner  POD #2  FEN: IV fluids/n.p.o. ID: None DVT: SCDs - add heparin Follow-up: Dr. Carolynne Edouard [id number], Isidor Holts., PA-C  Plan: Work  on pain control; add Robaxin, Toradol, and Tylenol.  Out of bed today.  DC Foley.  Work on Science writer.  Add heparin and home meds with sips of clears. Complaining of dizziness up in a chair.  We will give a small fluid bolus. 1430 hrs Still complaining of some dizziness, nausea, but not vomiting, and just generally feels bad.  Her BP is 181/104  HR 69.   1615 hrs She is refusing to use pain medicines, I stopped the PCA and left some bolus Morphine.  We will just monitor for now.      LOS: 3 days    Jamesyn Moorefield 04/07/2019 Please see Amion

## 2019-04-07 NOTE — Significant Event (Signed)
Rapid Response Event Note  Overview: Time Called: 1500 Arrival Time: 1501 Event Type: Cardiac  Initial Focused Assessment:  RRT called due to new onset hypertension. Patient POD 1 with exploratory lap due to cecal volvulos. Patient BP consistently 200 range systolic per bedside nursing staff. Patient received Hydralazine 10 IV, Metoprolol 2.5 IV, Zofran and phenergan prior to RRT arrival. Patient calm resting in bed slightly drowsy but awakens easily and is oriented X4. Patient states during movement she can not take the pain anymore. PCA Dilaudid encouraged as she hasn't used it this shift. Patient states she does not want any pain medication at this time. Patient continues to complain of nausea and is spiting up only thick mucus into emesis bag. Patient assisted to bedpan via NT. BP cuff readjusted BP now 165/106 which is back to baseline for this admission. Lung and heart sounds within defined limits, bowel sounds faint. Patient has known hypertension reportedly on HCTZ PO prior to admission which she has not received since 2/5. On admission BP in ED 201/141 3 days ago. CCS PA aware, PRN BP medication orders written. PCA encouraged to patient. IV team at bedside to obtain new IV. CCS PA at bedside discussing plan with patients daughter.   Event Summary: Name of Physician Notified: Marlyne Beards, W. PA, CCS at 1500    at    Outcome: Stayed in room and stabalized  Event End Time: 1520  Alicya Bena A Tishara Pizano

## 2019-04-07 NOTE — Progress Notes (Signed)
This shift pt had considerably less issue with managing pain with PCA, usage decreased. Pt states more concerned with feeling exhausetd. Reminded to continue to  Work on sitting in chair, TCDB and incentive spirometer use. Foley still in place per MD.

## 2019-04-07 NOTE — Progress Notes (Signed)
Patient BP 201/117. RR response called. Patient in yellow mews. Received verbal order to stop IV fluids. Rechecked BP 167/116. Patient states she is feeling a little better but continues to be dizzy and nauseous. Rechecked BP 165/106. RN will initiate yellow mews guidelines and continue to monitor the patient. Patient's daughter is at bedside.

## 2019-04-08 LAB — HEPATIC FUNCTION PANEL
ALT: 25 U/L (ref 0–44)
AST: 34 U/L (ref 15–41)
Albumin: 2.9 g/dL — ABNORMAL LOW (ref 3.5–5.0)
Alkaline Phosphatase: 51 U/L (ref 38–126)
Bilirubin, Direct: 0.2 mg/dL (ref 0.0–0.2)
Indirect Bilirubin: 0.8 mg/dL (ref 0.3–0.9)
Total Bilirubin: 1 mg/dL (ref 0.3–1.2)
Total Protein: 6.8 g/dL (ref 6.5–8.1)

## 2019-04-08 LAB — SURGICAL PATHOLOGY

## 2019-04-08 MED ORDER — SODIUM CHLORIDE 0.9 % IV SOLN: INTRAVENOUS | Status: DC

## 2019-04-08 NOTE — Progress Notes (Signed)
3 Days Post-Op    CC: Abdominal pain  Subjective: She is feeling better this a.m.  She did take some pain medicine last evening and seems more relaxed.  She says she feels like she has a pill stuck in her throat and it can makes her feel somewhat nauseated.  Overall she looks much better this a.m.  Objective: Vital signs in last 24 hours: Temp:  [97.9 F (36.6 C)-98.8 F (37.1 C)] 98.8 F (37.1 C) (02/09 0803) Pulse Rate:  [68-97] 88 (02/09 0803) Resp:  [15-20] 17 (02/09 0803) BP: (129-201)/(84-126) 138/84 (02/09 0803) SpO2:  [97 %-100 %] 99 % (02/09 0803) Last BM Date: 04/04/19 236 p.o. 1100 IV 250 urine recorded No BM Afebrile blood pressures improving CMP is essentially normal as is the CBC. Intake/Output from previous day: 02/08 0701 - 02/09 0700 In: 1362.6 [P.O.:236; I.V.:952.4; IV Piggyback:174.2] Out: 250 [Urine:250] Intake/Output this shift: No intake/output data recorded.  General appearance: alert, cooperative and no distress Resp: clear to auscultation bilaterally Cardio: Regular rate and rhythm GI: Soft, sore, midline honeycomb dressing is dry.  No flatus or BM so far. Extremities: extremities normal, atraumatic, no cyanosis or edema  Lab Results:  Recent Labs    04/07/19 0543  WBC 5.0  HGB 12.5  HCT 39.8  PLT 261    BMET Recent Labs    04/07/19 0543  NA 137  K 4.5  CL 105  CO2 25  GLUCOSE 116*  BUN 12  CREATININE 0.80  CALCIUM 8.5*   PT/INR No results for input(s): LABPROT, INR in the last 72 hours.  Recent Labs  Lab 04/04/19 1559 04/07/19 0543  AST 22 34  ALT 27 25  ALKPHOS 76 51  BILITOT 1.1 1.0  PROT 9.2* 6.8  ALBUMIN 4.7 2.9*     Lipase     Component Value Date/Time   LIPASE 47 04/04/2019 1559     Medications: . Chlorhexidine Gluconate Cloth  6 each Topical Daily  . famotidine  20 mg Oral BID  . heparin injection (subcutaneous)  5,000 Units Subcutaneous Q8H  . ketorolac  15 mg Intravenous Q6H  . pantoprazole  (PROTONIX) IV  40 mg Intravenous QHS   . methocarbamol (ROBAXIN) IV 500 mg (04/08/19 0846)    Assessment/Plan Hypertension   Cecal volvulus with small bowel obstruction Exploratory laparotomy, right colectomy, incidental appendectomy, lysis of adhesions, 04/05/2019, Dr. Royston Sinner  POD #3  FEN: IV fluids/n.p.o. ID: None DVT: SCDs - heparin Follow-up: Dr. Carolynne Edouard [id number], Isidor Holts., PA-C   Plan: Keep her on sips and chips of water today.  Continue IV fluids, mobilize, DC Foley.  LOS: 4 days    Tasha Bishop 04/08/2019 Please see Amion

## 2019-04-09 MED ORDER — METHOCARBAMOL 500 MG PO TABS
750.0000 mg | ORAL_TABLET | Freq: Three times a day (TID) | ORAL | Status: DC
  Administered 2019-04-09 (×3): 750 mg via ORAL
  Filled 2019-04-09 (×3): qty 2

## 2019-04-09 MED ORDER — ACETAMINOPHEN 500 MG PO TABS
1000.0000 mg | ORAL_TABLET | Freq: Three times a day (TID) | ORAL | Status: DC
  Administered 2019-04-09 (×2): 1000 mg via ORAL
  Filled 2019-04-09 (×2): qty 2

## 2019-04-09 NOTE — Progress Notes (Signed)
4 Days Post-Op    CC: Abdominal pain  Subjective: Nausea is better, she has a few bowel sounds.  She still feels bloated.  No flatus or BM so far.  Midline incision looks fine.  She feels febrile this morning at oral temperature is 100.4.  Few rales in her bases.  Objective: Vital signs in last 24 hours: Temp:  [97.7 F (36.5 C)-99.4 F (37.4 C)] 98.6 F (37 C) (02/10 0800) Pulse Rate:  [72-94] 94 (02/10 0800) Resp:  [17-20] 20 (02/10 0800) BP: (144-166)/(87-100) 166/97 (02/10 0800) SpO2:  [97 %-100 %] 97 % (02/10 0800) Last BM Date: 04/04/19 240 p.o. 600 IV recorded   . sodium chloride Stopped (04/08/19 1540) NS @ 75cc/hr ordered  . methocarbamol (ROBAXIN) IV 500 mg (04/08/19 2351)   400 urine recorded No BM recorded Afebrile T-max 99.4 blood pressure still mildly elevated. No labs today    intake/Output from previous day: 02/09 0701 - 02/10 0700 In: 800.9 [P.O.:240; I.V.:422.5; IV Piggyback:138.3] Out: 400 [Urine:400] Intake/Output this shift: No intake/output data recorded.  General appearance: alert, cooperative and no distress Resp: Few rales in both bases. Cardio: Regular rate and rhythm but she is tachycardic. GI: Soft, sore, she has a few hypoactive bowel sounds.  No flatus or BM so far.  Midline incision looks fine. Extremities: extremities normal, atraumatic, no cyanosis or edema  Lab Results:  Recent Labs    04/07/19 0543  WBC 5.0  HGB 12.5  HCT 39.8  PLT 261    BMET Recent Labs    04/07/19 0543  NA 137  K 4.5  CL 105  CO2 25  GLUCOSE 116*  BUN 12  CREATININE 0.80  CALCIUM 8.5*   PT/INR No results for input(s): LABPROT, INR in the last 72 hours.  Recent Labs  Lab 04/04/19 1559 04/07/19 0543  AST 22 34  ALT 27 25  ALKPHOS 76 51  BILITOT 1.1 1.0  PROT 9.2* 6.8  ALBUMIN 4.7 2.9*     Lipase     Component Value Date/Time   LIPASE 47 04/04/2019 1559     Medications: . Chlorhexidine Gluconate Cloth  6 each Topical Daily  .  famotidine  20 mg Oral BID  . heparin injection (subcutaneous)  5,000 Units Subcutaneous Q8H  . ketorolac  15 mg Intravenous Q6H  . pantoprazole (PROTONIX) IV  40 mg Intravenous QHS    Assessment/Plan Hypertension  Cecal volvulus with small bowel obstruction Exploratory laparotomy, right colectomy, incidental appendectomy, lysis of adhesions, 04/05/2019, Dr. Royston Sinner POD #4  FEN: IV fluids/n.p.o. ID: None DVT: SCDs- heparin Follow-up: Dr. Carolynne Edouard MBW:[id number], Isidor Holts., PA-C  Plan: Start her on some clears.  Work on Oncologist.  She is moving about 800-900 this a.m. she says she was doing better yesterday.  She has not had anything for pain.  Continue to mobilize.  Recheck labs in a.m.     LOS: 5 days    Neave Lenger 04/09/2019 Please see Amion

## 2019-04-10 ENCOUNTER — Inpatient Hospital Stay (HOSPITAL_COMMUNITY): Payer: BC Managed Care – PPO

## 2019-04-10 LAB — CBC
HCT: 41.6 % (ref 36.0–46.0)
Hemoglobin: 13.4 g/dL (ref 12.0–15.0)
MCH: 28.2 pg (ref 26.0–34.0)
MCHC: 32.2 g/dL (ref 30.0–36.0)
MCV: 87.4 fL (ref 80.0–100.0)
Platelets: 372 10*3/uL (ref 150–400)
RBC: 4.76 MIL/uL (ref 3.87–5.11)
RDW: 13.9 % (ref 11.5–15.5)
WBC: 14.3 10*3/uL — ABNORMAL HIGH (ref 4.0–10.5)
nRBC: 0 % (ref 0.0–0.2)

## 2019-04-10 LAB — BASIC METABOLIC PANEL
Anion gap: 14 (ref 5–15)
BUN: 17 mg/dL (ref 6–20)
CO2: 20 mmol/L — ABNORMAL LOW (ref 22–32)
Calcium: 9 mg/dL (ref 8.9–10.3)
Chloride: 100 mmol/L (ref 98–111)
Creatinine, Ser: 0.75 mg/dL (ref 0.44–1.00)
GFR calc Af Amer: >60 mL/min (ref >60)
GFR calc non Af Amer: >60 mL/min (ref >60)
Glucose, Bld: 87 mg/dL (ref 70–99)
Potassium: 3.9 mmol/L (ref 3.5–5.1)
Sodium: 134 mmol/L — ABNORMAL LOW (ref 135–145)

## 2019-04-10 MED ORDER — SODIUM CHLORIDE (PF) 0.9 % IJ SOLN
INTRAMUSCULAR | Status: AC
  Filled 2019-04-10: qty 50

## 2019-04-10 MED ORDER — IOHEXOL 300 MG/ML  SOLN
100.0000 mL | Freq: Once | INTRAMUSCULAR | Status: AC | PRN
  Administered 2019-04-10: 15:00:00 100 mL via INTRAVENOUS

## 2019-04-10 MED ORDER — IOHEXOL 9 MG/ML PO SOLN
ORAL | Status: AC
  Filled 2019-04-10: qty 1000

## 2019-04-10 MED ORDER — CLONIDINE HCL 0.1 MG/24HR TD PTWK
0.1000 mg | MEDICATED_PATCH | TRANSDERMAL | Status: DC
  Administered 2019-04-10: 0.1 mg via TRANSDERMAL
  Filled 2019-04-10: qty 1

## 2019-04-10 MED ORDER — METHOCARBAMOL 1000 MG/10ML IJ SOLN
500.0000 mg | Freq: Three times a day (TID) | INTRAVENOUS | Status: DC
  Administered 2019-04-10 – 2019-04-13 (×10): 500 mg via INTRAVENOUS
  Filled 2019-04-10: qty 500
  Filled 2019-04-10 (×2): qty 5
  Filled 2019-04-10 (×2): qty 500
  Filled 2019-04-10: qty 5
  Filled 2019-04-10 (×4): qty 500
  Filled 2019-04-10: qty 5
  Filled 2019-04-10 (×3): qty 500

## 2019-04-10 MED ORDER — IOHEXOL 9 MG/ML PO SOLN
500.0000 mL | ORAL | Status: AC
  Administered 2019-04-10: 500 mL via ORAL

## 2019-04-10 NOTE — Progress Notes (Signed)
   Vital Signs MEWS/VS Documentation      04/10/2019 0641 04/10/2019 0752 04/10/2019 1000 04/10/2019 1203   MEWS Score:  0  2  2  1    MEWS Score Color:  Green  Yellow  Yellow  Green   Resp:  18  18  16  18    Pulse:  100  (!) 125  (!) 115  (!) 110   BP:  (!) 156/105  (!) 160/97  (!) 146/91  (!) 151/92   Temp:  98.8 F (37.1 C)  97.7 F (36.5 C)  98.3 F (36.8 C)  99.8 F (37.7 C)   O2 Device:  --  Room Air  --  Room Air     After pain and blood pressure medications given this am, patient noted to be feeling "somewhat better" per patient.  Will continue to monitor via yellow mews for any needed interventions.   04/10/2019,1:56 PM

## 2019-04-10 NOTE — Progress Notes (Addendum)
5 Days Post-Op    CC: Abdominal pain  Subjective: She feels terrible this morning.  She had nausea most of the day but had 2 bowel movements.  She started vomiting early this a.m. a fairly large volume.  She still nauseated aches all over she is taking very little for pain.  She is again running low-grade fever 100.3 orally this AM.  Objective: Vital signs in last 24 hours: Temp:  [97.7 F (36.5 C)-100.4 F (38 C)] 97.7 F (36.5 C) (02/11 0752) Pulse Rate:  [94-125] 125 (02/11 0752) Resp:  [16-20] 18 (02/11 0752) BP: (156-166)/(95-105) 160/97 (02/11 0752) SpO2:  [97 %-100 %] 99 % (02/11 0752) Last BM Date: 04/09/19 480 p.o. recorded 577 IV Urine x5 Stool x1 Afebrile blood pressure still markedly elevated. NA 134, K+ 3.9 WBC 14.3 CXR: Mild cardiomegaly and lower lobe atelectasis Intake/Output from previous day: 02/10 0701 - 02/11 0700 In: 1057.5 [P.O.:480; I.V.:577.5] Out: -  Intake/Output this shift: No intake/output data recorded.  General appearance: alert, cooperative and she feels terrible, much like she did 2-3 days ago Resp: some rales at her base. She is moving up to 1000 on IS Cardio: sinus, just got lopressor for her Bp so she is not tachycardic GI: soft, but sore, midline incision is OK, BS very hypoactive, she did have 2 BM's yesterday Extremities: extremities normal, atraumatic, no cyanosis or edema  Lab Results:  Recent Labs    04/10/19 0545  WBC 14.3*  HGB 13.4  HCT 41.6  PLT 372    BMET Recent Labs    04/10/19 0545  NA 134*  K 3.9  CL 100  CO2 20*  GLUCOSE 87  BUN 17  CREATININE 0.75  CALCIUM 9.0   PT/INR No results for input(s): LABPROT, INR in the last 72 hours.  Recent Labs  Lab 04/04/19 1559 04/07/19 0543  AST 22 34  ALT 27 25  ALKPHOS 76 51  BILITOT 1.1 1.0  PROT 9.2* 6.8  ALBUMIN 4.7 2.9*     Lipase     Component Value Date/Time   LIPASE 47 04/04/2019 1559     Medications: . acetaminophen  1,000 mg Oral Q8H  .  Chlorhexidine Gluconate Cloth  6 each Topical Daily  . famotidine  20 mg Oral BID  . heparin injection (subcutaneous)  5,000 Units Subcutaneous Q8H  . ketorolac  15 mg Intravenous Q6H  . methocarbamol  750 mg Oral TID  . pantoprazole (PROTONIX) IV  40 mg Intravenous QHS    Assessment/Plan Hypertension  Cecal volvulus with small bowel obstruction Exploratory laparotomy, right colectomy, incidental appendectomy, lysis of adhesions, 04/05/2019, Dr. Lorine Bears POD #5  FEN: IV fluids/clears ID: None DVT: SCDs- heparin Follow-up: Dr. Marlou Starks VQM:[id number], Baldemar Friday., PA-C Tylenol x 2, toradol x 4, robaxin x 3, morphine x 1  Plan:  She is febrile again this AM, 100.3 oral I think it may even be higher if we did a core temp. CXR shows some atelectasis and cardiomegaly.   WBC is up, she feels terrible, abdominal exam isn't abnormal for POD 5 except for her hypoactive BS.   I am going to to get a CT of the abdomen/pelvis this AM.   She has percocet listed as an allergy, she told me she takes Tylenol at home, so we assumed it was the Oxycodone that was at issue; I will stop the Tylenol for now also.  She is back from CT, CT shows interval postop findings from the ileocecal resection/anastomosis.  Proximal to mid small bowel is fluid and contrast filled but not overly distended the largest loop measures 3.2 cm.  There is scattered stool present in the colon to the rectum.  Constellation of findings most consistent with postoperative ileus.  Midline laparotomy with subcutaneous fluid along the incisional line likely postoperative hematoma/seroma.  Some nonspecific postoperative ascites in the pelvis.  I am going to put her back on clears and see how she does.   LOS: 6 days    Tasha Bishop 04/10/2019 Please see Amion

## 2019-04-10 NOTE — Progress Notes (Signed)
Dr Carolynne Edouard aware of emesis.  Stat chest xray ordered, encouraged Ng tube(patient refused)resting quietly at present

## 2019-04-10 NOTE — Progress Notes (Signed)
   Vital Signs MEWS/VS Documentation      04/10/2019 0008 04/10/2019 0500 04/10/2019 0641 04/10/2019 0752   MEWS Score:  1  1  0  2   MEWS Score Color:  Green  Green  Green  Yellow   Resp:  16  18  18  18    Pulse:  (!) 103  (!) 103  100  (!) 125   BP:  (!) 161/101  (!) 164/103  (!) 156/105  (!) 160/97   Temp:  98.9 F (37.2 C)  99.1 F (37.3 C)  98.8 F (37.1 C)  97.7 F (36.5 C)   O2 Device:  --  --  --  Room Air       Surgical PA and MD notified of patients change in condition and her multiple events of nausea and vomiting overnight.  Will continue to monitor via yellow mews protocol for any acute changed.      04/10/2019,8:25 AM

## 2019-04-10 NOTE — Progress Notes (Signed)
Large amount bilious emesis(approx 110cc) On call notified awaiting call back

## 2019-04-11 LAB — COMPREHENSIVE METABOLIC PANEL
ALT: 20 U/L (ref 0–44)
AST: 14 U/L — ABNORMAL LOW (ref 15–41)
Albumin: 2.6 g/dL — ABNORMAL LOW (ref 3.5–5.0)
Alkaline Phosphatase: 76 U/L (ref 38–126)
Anion gap: 9 (ref 5–15)
BUN: 15 mg/dL (ref 6–20)
CO2: 18 mmol/L — ABNORMAL LOW (ref 22–32)
Calcium: 8.1 mg/dL — ABNORMAL LOW (ref 8.9–10.3)
Chloride: 104 mmol/L (ref 98–111)
Creatinine, Ser: 0.65 mg/dL (ref 0.44–1.00)
GFR calc Af Amer: >60 mL/min (ref >60)
GFR calc non Af Amer: >60 mL/min (ref >60)
Glucose, Bld: 81 mg/dL (ref 70–99)
Potassium: 3.1 mmol/L — ABNORMAL LOW (ref 3.5–5.1)
Sodium: 131 mmol/L — ABNORMAL LOW (ref 135–145)
Total Bilirubin: 1.1 mg/dL (ref 0.3–1.2)
Total Protein: 6 g/dL — ABNORMAL LOW (ref 6.5–8.1)

## 2019-04-11 LAB — CBC
HCT: 34.3 % — ABNORMAL LOW (ref 36.0–46.0)
Hemoglobin: 11 g/dL — ABNORMAL LOW (ref 12.0–15.0)
MCH: 28.3 pg (ref 26.0–34.0)
MCHC: 32.1 g/dL (ref 30.0–36.0)
MCV: 88.2 fL (ref 80.0–100.0)
Platelets: 314 10*3/uL (ref 150–400)
RBC: 3.89 MIL/uL (ref 3.87–5.11)
RDW: 14.1 % (ref 11.5–15.5)
WBC: 13.5 10*3/uL — ABNORMAL HIGH (ref 4.0–10.5)
nRBC: 0 % (ref 0.0–0.2)

## 2019-04-11 LAB — PREALBUMIN: Prealbumin: 5.7 mg/dL — ABNORMAL LOW (ref 18–38)

## 2019-04-11 LAB — MAGNESIUM: Magnesium: 2.1 mg/dL (ref 1.7–2.4)

## 2019-04-11 MED ORDER — POTASSIUM CHLORIDE IN NACL 40-0.9 MEQ/L-% IV SOLN
INTRAVENOUS | Status: DC
  Administered 2019-04-11 – 2019-04-15 (×5): 75 mL/h via INTRAVENOUS
  Filled 2019-04-11 (×8): qty 1000

## 2019-04-11 MED ORDER — ACETAMINOPHEN 325 MG PO TABS
650.0000 mg | ORAL_TABLET | Freq: Four times a day (QID) | ORAL | Status: DC | PRN
  Administered 2019-04-11: 650 mg via ORAL
  Filled 2019-04-11: qty 2

## 2019-04-11 MED ORDER — POTASSIUM CHLORIDE CRYS ER 20 MEQ PO TBCR
20.0000 meq | EXTENDED_RELEASE_TABLET | Freq: Two times a day (BID) | ORAL | Status: AC
  Administered 2019-04-11 (×2): 20 meq via ORAL
  Filled 2019-04-11 (×2): qty 1

## 2019-04-11 NOTE — Progress Notes (Signed)
6 Days Post-Op    CC: Abdominal pain  Subjective: She is doing better this a.m.  Still no flatus or BM.  She is tolerating clears but has not taken very much in.  Midline incision looks fine despite CT showing some fluid in the subcutaneous fat the superficial wound and staple line looks fine.  Objective: Vital signs in last 24 hours: Temp:  [98.3 F (36.8 C)-99.8 F (37.7 C)] 99.8 F (37.7 C) (02/12 0543) Pulse Rate:  [100-115] 100 (02/12 0543) Resp:  [16-18] 16 (02/12 0543) BP: (145-160)/(85-92) 160/92 (02/12 0543) SpO2:  [96 %-99 %] 99 % (02/12 0543) Last BM Date: 04/09/19 240 p.o. record 660 IV Voided x4 No BM recorded Afebrile blood pressure still elevated. K+ 3.1 WBC 13.5 Intake/Output from previous day: 02/11 0701 - 02/12 0700 In: 950 [P.O.:240; I.V.:660; IV Piggyback:50] Out: -  Intake/Output this shift: No intake/output data recorded.  General appearance: alert, cooperative and no distress Resp: clear to auscultation bilaterally moving over thousand on incentive spirometry. GI: Soft, bowel sounds are still hypoactive.  No BM or flatus yesterday.  Midline incision looks fine that will see any drainage at all.  There is no erythema.  Staples are all intact.   Lab Results:  Recent Labs    04/10/19 0545 04/11/19 0438  WBC 14.3* 13.5*  HGB 13.4 11.0*  HCT 41.6 34.3*  PLT 372 314    BMET Recent Labs    04/10/19 0545 04/11/19 0438  NA 134* 131*  K 3.9 3.1*  CL 100 104  CO2 20* 18*  GLUCOSE 87 81  BUN 17 15  CREATININE 0.75 0.65  CALCIUM 9.0 8.1*   PT/INR No results for input(s): LABPROT, INR in the last 72 hours.  Recent Labs  Lab 04/04/19 1559 04/07/19 0543 04/11/19 0438  AST 22 34 14*  ALT 27 25 20   ALKPHOS 76 51 76  BILITOT 1.1 1.0 1.1  PROT 9.2* 6.8 6.0*  ALBUMIN 4.7 2.9* 2.6*     Lipase     Component Value Date/Time   LIPASE 47 04/04/2019 1559     Medications: . cloNIDine  0.1 mg Transdermal Weekly  . heparin injection  (subcutaneous)  5,000 Units Subcutaneous Q8H  . ketorolac  15 mg Intravenous Q6H  . pantoprazole (PROTONIX) IV  40 mg Intravenous QHS  . potassium chloride  20 mEq Oral BID   . sodium chloride 75 mL/hr at 04/11/19 0902  . methocarbamol (ROBAXIN) IV 500 mg (04/11/19 0254)   Assessment/Plan Hypertension  Cecal volvulus with small bowel obstruction Exploratory laparotomy, right colectomy, incidental appendectomy, lysis of adhesions, 04/05/2019, Dr. 06/03/2019 POD #6   -CT 2/11: Ileus with distended loop up to 3.2 cm.  Subcutaneous tissue along the incision  line-postoperative hematoma/seroma  FEN: IV fluids/clears ID: None DVT: SCDs- heparin Follow-up: Dr. 4/11 Carolynne Edouard, [id number]., PA-C  toradol x 4, robaxin x 3, morphine x 1   Plan: Continue clear liquids for now.  Continue to mobilize and work on incentive spirometry.  Magnesium and replace potassium.  LOS: 7 days    Synai Prettyman 04/11/2019 Please see Amion

## 2019-04-11 NOTE — Progress Notes (Signed)
MEWS Guidelines - (patients age 51 and over)  Red - At High Risk for Deterioration Yellow - At risk for Deterioration  1. Go to room and assess patient 2. Validate data. Is this patient's baseline? If data confirmed: 3. Is this an acute change? 4. Administer prn meds/treatments as ordered. 5. Note Sepsis score 6. Review goals of care 7. Radiation protection practitioner, RRT nurse and Provider. 8. Ask Provider to come to bedside.  9. Document patient condition/interventions/response. 10. Increase frequency of vital signs and focused assessments to at least q15 minutes x 4, then q30 minutes x2. - If stable, then q1h x3, then q4h x3 and then q8h or dept. routine. - If unstable, contact Provider & RRT nurse. Prepare for possible transfer. 11. Add entry in progress notes using the smart phrase ".MEWS". 1. Go to room and assess patient 2. Validate data. Is this patient's baseline? If data confirmed: 3. Is this an acute change? 4. Administer prn meds/treatments as ordered? 5. Note Sepsis score 6. Review goals of care 7. Radiation protection practitioner and Provider 8. Call RRT nurse as needed. 9. Document patient condition/interventions/response. 10. Increase frequency of vital signs and focused assessments to at least q2h x2. - If stable, then q4h x2 and then q8h or dept. routine. - If unstable, contact Provider & RRT nurse. Prepare for possible transfer. 11. Add entry in progress notes using the smart phrase ".MEWS".  Green - Likely stable Lavender - Comfort Care Only  1. Continue routine/ordered monitoring.  2. Review goals of care. 1. Continue routine/ordered monitoring. 2. Review goals of care.  Yellow mews activated, room temp cooled down , removed excess blankets. [facility] amion text page sent

## 2019-04-12 LAB — BASIC METABOLIC PANEL
Anion gap: 9 (ref 5–15)
BUN: 10 mg/dL (ref 6–20)
CO2: 19 mmol/L — ABNORMAL LOW (ref 22–32)
Calcium: 8.3 mg/dL — ABNORMAL LOW (ref 8.9–10.3)
Chloride: 106 mmol/L (ref 98–111)
Creatinine, Ser: 0.65 mg/dL (ref 0.44–1.00)
GFR calc Af Amer: >60 mL/min (ref >60)
GFR calc non Af Amer: >60 mL/min (ref >60)
Glucose, Bld: 85 mg/dL (ref 70–99)
Potassium: 3.7 mmol/L (ref 3.5–5.1)
Sodium: 134 mmol/L — ABNORMAL LOW (ref 135–145)

## 2019-04-12 LAB — CBC
HCT: 33.6 % — ABNORMAL LOW (ref 36.0–46.0)
Hemoglobin: 11.1 g/dL — ABNORMAL LOW (ref 12.0–15.0)
MCH: 28.3 pg (ref 26.0–34.0)
MCHC: 33 g/dL (ref 30.0–36.0)
MCV: 85.7 fL (ref 80.0–100.0)
Platelets: 330 10*3/uL (ref 150–400)
RBC: 3.92 MIL/uL (ref 3.87–5.11)
RDW: 14.2 % (ref 11.5–15.5)
WBC: 13.6 10*3/uL — ABNORMAL HIGH (ref 4.0–10.5)
nRBC: 0 % (ref 0.0–0.2)

## 2019-04-12 MED ORDER — ACETAMINOPHEN 500 MG PO TABS
1000.0000 mg | ORAL_TABLET | Freq: Four times a day (QID) | ORAL | Status: DC
  Administered 2019-04-12 – 2019-04-13 (×4): 1000 mg via ORAL
  Filled 2019-04-12 (×8): qty 2

## 2019-04-12 MED ORDER — PIPERACILLIN-TAZOBACTAM 3.375 G IVPB 30 MIN
3.3750 g | Freq: Once | INTRAVENOUS | Status: AC
  Administered 2019-04-12: 3.375 g via INTRAVENOUS
  Filled 2019-04-12: qty 50

## 2019-04-12 MED ORDER — TRAMADOL HCL 50 MG PO TABS: 50.0000 mg | ORAL_TABLET | Freq: Four times a day (QID) | ORAL | Status: DC | PRN

## 2019-04-12 MED ORDER — PIPERACILLIN-TAZOBACTAM 3.375 G IVPB
3.3750 g | Freq: Three times a day (TID) | INTRAVENOUS | Status: DC
  Administered 2019-04-12 – 2019-04-15 (×9): 3.375 g via INTRAVENOUS
  Filled 2019-04-12 (×10): qty 50

## 2019-04-12 MED ORDER — IBUPROFEN 200 MG PO TABS
600.0000 mg | ORAL_TABLET | Freq: Four times a day (QID) | ORAL | Status: DC | PRN
  Filled 2019-04-12: qty 3

## 2019-04-12 NOTE — Progress Notes (Signed)
7 Days Post-Op    CC: Abdominal pain  Subjective: Feeling better today - some nausea, no emesis. Passing some gas, had small bm last night.  Continues to be intermittently febrile  Objective: Vital signs in last 24 hours: Temp:  [98.2 F (36.8 C)-101.4 F (38.6 C)] 99.1 F (37.3 C) (02/13 0729) Pulse Rate:  [93-104] 93 (02/13 0517) Resp:  [16-20] 19 (02/13 0517) BP: (134-180)/(80-100) 134/86 (02/13 0517) SpO2:  [98 %-100 %] 99 % (02/13 0517) Last BM Date: 04/09/19  Intake/Output from previous day: 02/12 0701 - 02/13 0700 In: 240 [P.O.:240] Out: -  Intake/Output this shift: No intake/output data recorded.  General appearance: alert, NAD Resp: normal resp effort Cardio: rrr GI: soft, warm around wound, no active drainage, mild erythema. Nontender, not significantly distended. Some staples removed at bedside and wound opened - ~300 cc of purulent fluid freely drained  Lab Results:  Recent Labs    04/11/19 0438 04/12/19 0525  WBC 13.5* 13.6*  HGB 11.0* 11.1*  HCT 34.3* 33.6*  PLT 314 330    BMET Recent Labs    04/11/19 0438 04/12/19 0525  NA 131* 134*  K 3.1* 3.7  CL 104 106  CO2 18* 19*  GLUCOSE 81 85  BUN 15 10  CREATININE 0.65 0.65  CALCIUM 8.1* 8.3*   PT/INR No results for input(s): LABPROT, INR in the last 72 hours.  Recent Labs  Lab 04/07/19 0543 04/11/19 0438  AST 34 14*  ALT 25 20  ALKPHOS 51 76  BILITOT 1.0 1.1  PROT 6.8 6.0*  ALBUMIN 2.9* 2.6*     Lipase     Component Value Date/Time   LIPASE 47 04/04/2019 1559     Medications: . cloNIDine  0.1 mg Transdermal Weekly  . heparin injection (subcutaneous)  5,000 Units Subcutaneous Q8H  . ketorolac  15 mg Intravenous Q6H  . pantoprazole (PROTONIX) IV  40 mg Intravenous QHS    Assessment/Plan Hypertension  Cecal volvulus with small bowel obstruction Exploratory laparotomy, right colectomy, incidental appendectomy, lysis of adhesions, 04/05/2019, Dr. Royston Sinner POD #7  FEN: IV  fluids/clears, ADAT to full liquids ID: Start IV Zosyn today - plan to cont x24hrs DVT: SQH, SCDs Wick 1 gauze in wound, do not pack so gauze isn't lost in wound. Demonstrated for nursing at bedside. Cover with 4x4s, ABD. Hopefully begins to really turn the corner now that we have addressed the source Ambulate 5x/day Follow-up: Dr. Carolynne Edouard  Plan:   LOS: 8 days   Marin Olp, M.D. [facility] Use [email or web address] to contact on call provider

## 2019-04-12 NOTE — Progress Notes (Signed)
During medication administration, noted bright red blood present on ABD pad that was placed by Dr. Cliffton Asters during AM rounds. ABD pad and gauze bandages were removed. Bloody drainage flowed from incision site. Cleaned and dried patient's abdomen before placing clean gauze and ABD pad. Patient complained of abdominal tenderness and pain. Tylenol 1000 mg PO given as ordered. Noted blood present on gown and other bed linen. Daily care will be provided. Will continue to monitor.

## 2019-04-12 NOTE — Progress Notes (Signed)
Pharmacy Antibiotic Note  Tasha Bishop is a 49 y.o. female admitted on 04/04/2019. 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  Pharmacy has been consulted for piperacillin/tazobactam dosing for wound infection.  Today, 04/12/19  WBC 13.6  SCr 0.65, CrCl >60 mL/min. Renal function has been stable & WNL throughout admission  Tmax 101.4 F  Plan:  Piperacillin/tazobactam 3.375 g IV once over 30 mins followed by piperacillin/tazobactam 3.375 g IV q8h EI  Renal function has remained stable. Cultures negative. Pharmacy to sign off and follow dosing peripherally, please re-consult if needed.   Height: 5\' 5"  (165.1 cm) Weight: 227 lb (103 kg) IBW/kg (Calculated) : 57  Temp (24hrs), Avg:99.4 F (37.4 C), Min:98.2 F (36.8 C), Max:101.4 F (38.6 C)  Recent Labs  Lab 04/07/19 0543 04/10/19 0545 04/11/19 0438 04/12/19 0525  WBC 5.0 14.3* 13.5* 13.6*  CREATININE 0.80 0.75 0.65 0.65    Estimated Creatinine Clearance: 100.1 mL/min (by C-G formula based on SCr of 0.65 mg/dL).    Allergies  Allergen Reactions  . Percocet [Oxycodone-Acetaminophen] Itching    Itching many years ago, take Tylenol at home without issue    Antimicrobials this admission: Piperacillin/tazobactam 2/13 >>   Dose adjustments this admission:  Microbiology results: 2/5 UCx: NGF   3/13, PharmD 04/12/2019 8:48 AM

## 2019-04-13 LAB — BASIC METABOLIC PANEL
Anion gap: 10 (ref 5–15)
BUN: 8 mg/dL (ref 6–20)
CO2: 20 mmol/L — ABNORMAL LOW (ref 22–32)
Calcium: 8.4 mg/dL — ABNORMAL LOW (ref 8.9–10.3)
Chloride: 108 mmol/L (ref 98–111)
Creatinine, Ser: 0.61 mg/dL (ref 0.44–1.00)
GFR calc Af Amer: >60 mL/min (ref >60)
GFR calc non Af Amer: >60 mL/min (ref >60)
Glucose, Bld: 84 mg/dL (ref 70–99)
Potassium: 4.3 mmol/L (ref 3.5–5.1)
Sodium: 138 mmol/L (ref 135–145)

## 2019-04-13 LAB — CBC WITH DIFFERENTIAL/PLATELET
Abs Immature Granulocytes: 0.46 10*3/uL — ABNORMAL HIGH (ref 0.00–0.07)
Basophils Absolute: 0.1 10*3/uL (ref 0.0–0.1)
Basophils Relative: 0 %
Eosinophils Absolute: 0 10*3/uL (ref 0.0–0.5)
Eosinophils Relative: 0 %
HCT: 32.1 % — ABNORMAL LOW (ref 36.0–46.0)
Hemoglobin: 10.5 g/dL — ABNORMAL LOW (ref 12.0–15.0)
Immature Granulocytes: 3 %
Lymphocytes Relative: 10 %
Lymphs Abs: 1.6 10*3/uL (ref 0.7–4.0)
MCH: 28.4 pg (ref 26.0–34.0)
MCHC: 32.7 g/dL (ref 30.0–36.0)
MCV: 86.8 fL (ref 80.0–100.0)
Monocytes Absolute: 1.2 10*3/uL — ABNORMAL HIGH (ref 0.1–1.0)
Monocytes Relative: 8 %
Neutro Abs: 12.4 10*3/uL — ABNORMAL HIGH (ref 1.7–7.7)
Neutrophils Relative %: 79 %
Platelets: 381 10*3/uL (ref 150–400)
RBC: 3.7 MIL/uL — ABNORMAL LOW (ref 3.87–5.11)
RDW: 14.6 % (ref 11.5–15.5)
WBC: 15.8 10*3/uL — ABNORMAL HIGH (ref 4.0–10.5)
nRBC: 0 % (ref 0.0–0.2)

## 2019-04-13 NOTE — Progress Notes (Signed)
Patient ambulated 50 feet in hall using front wheel walker. Dressing change to abdominal wound completed as ordered. Patient declined any additional pain medication at this time. Patient left in bed resting with call bell within reach. Will continue to monitor.

## 2019-04-13 NOTE — Progress Notes (Signed)
8 Days Post-Op    CC: Abdominal pain  Subjective: Nausea today; no emesis. Passing gas and has had BM. No more fevers since wound opened  Objective: Vital signs in last 24 hours: Temp:  [98.4 F (36.9 C)-100.3 F (37.9 C)] 99.3 F (37.4 C) (02/14 0528) Pulse Rate:  [81-108] 81 (02/14 0528) Resp:  [16-22] 22 (02/14 0528) BP: (121-150)/(86-91) 150/91 (02/14 0528) SpO2:  [98 %-99 %] 99 % (02/14 0528) Last BM Date: 04/09/19  Intake/Output from previous day: 02/13 0701 - 02/14 0700 In: 2038.5 [I.V.:1978.1; IV Piggyback:60.4] Out: -  Intake/Output this shift: No intake/output data recorded.  General appearance: alert, NAD Resp: normal resp effort Cardio: rrr GI: soft, nontender; not significantly distended; dressing changed - clean kerlex loosely packed into wound  Lab Results:  Recent Labs    04/12/19 0525 04/13/19 0500  WBC 13.6* 15.8*  HGB 11.1* 10.5*  HCT 33.6* 32.1*  PLT 330 381    BMET Recent Labs    04/12/19 0525 04/13/19 0500  NA 134* 138  K 3.7 4.3  CL 106 108  CO2 19* 20*  GLUCOSE 85 84  BUN 10 8  CREATININE 0.65 0.61  CALCIUM 8.3* 8.4*   PT/INR No results for input(s): LABPROT, INR in the last 72 hours.  Recent Labs  Lab 04/07/19 0543 04/11/19 0438  AST 34 14*  ALT 25 20  ALKPHOS 51 76  BILITOT 1.0 1.1  PROT 6.8 6.0*  ALBUMIN 2.9* 2.6*     Lipase     Component Value Date/Time   LIPASE 47 04/04/2019 1559     Medications: . acetaminophen  1,000 mg Oral Q6H  . cloNIDine  0.1 mg Transdermal Weekly  . heparin injection (subcutaneous)  5,000 Units Subcutaneous Q8H  . pantoprazole (PROTONIX) IV  40 mg Intravenous QHS    Assessment/Plan Hypertension  Cecal volvulus with small bowel obstruction Exploratory laparotomy, right colectomy, incidental appendectomy, lysis of adhesions, 04/05/2019, Dr. Royston Sinner POD #8  FEN: IV fluids/clears, ADAT to full liquids ID: Cont IV Zosyn today - reassess tomorrow for d/c - wbc up some today but  wound wasn't being packed and more pus had accumulated - now draining. DVT: SQH, SCDs Loosely pack moist kerlex - single piece. Change BID (will change 3 times today to get things cleaning up. Demonstrated for nursing at bedside. Cover with 4x4s, ABD. Hopefully begins to really turn the corner now that we have addressed the source Ambulate 5x/day Follow-up: Dr. Carolynne Edouard  Plan:   LOS: 9 days   Marin Olp, M.D. [facility] Use [email or web address] to contact on call provider

## 2019-04-13 NOTE — Progress Notes (Signed)
Patient ambulated 50 feet in hall using front wheel walker. Dressing change to abdominal incision completed per patient request. Patient stated "it is starting to smell". Patient declined any pain medication at this time. Patient left in bed resting with call bell within reach. Will continue to monitor.

## 2019-04-14 LAB — CBC
HCT: 33.2 % — ABNORMAL LOW (ref 36.0–46.0)
Hemoglobin: 11.1 g/dL — ABNORMAL LOW (ref 12.0–15.0)
MCH: 28.5 pg (ref 26.0–34.0)
MCHC: 33.4 g/dL (ref 30.0–36.0)
MCV: 85.3 fL (ref 80.0–100.0)
Platelets: 419 10*3/uL — ABNORMAL HIGH (ref 150–400)
RBC: 3.89 MIL/uL (ref 3.87–5.11)
RDW: 14.5 % (ref 11.5–15.5)
WBC: 14 10*3/uL — ABNORMAL HIGH (ref 4.0–10.5)
nRBC: 0.1 % (ref 0.0–0.2)

## 2019-04-14 MED ORDER — MORPHINE SULFATE (PF) 2 MG/ML IV SOLN: 1.0000 mg | INTRAVENOUS | Status: DC | PRN

## 2019-04-14 MED ORDER — BOOST / RESOURCE BREEZE PO LIQD CUSTOM
1.0000 | Freq: Two times a day (BID) | ORAL | Status: DC
  Administered 2019-04-14: 1 via ORAL

## 2019-04-14 MED ORDER — METHOCARBAMOL 500 MG PO TABS: 500.0000 mg | ORAL_TABLET | Freq: Three times a day (TID) | ORAL | Status: DC | PRN

## 2019-04-14 MED ORDER — AMLODIPINE BESYLATE 5 MG PO TABS
5.0000 mg | ORAL_TABLET | Freq: Every day | ORAL | Status: DC
  Administered 2019-04-14 – 2019-04-15 (×2): 5 mg via ORAL
  Filled 2019-04-14 (×2): qty 1

## 2019-04-14 NOTE — Progress Notes (Addendum)
[facility] Progress Note  9 Days Post-Op  Subjective: CC-  No n/v since Saturday. States that abdominal pain is improving. Tolerating clear liquids. BM yesterday and another small one this morning.  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  ROS: See above, otherwise other systems negative   Objective: Vital signs in last 24 hours: Temp:  [98.2 F (36.8 C)-99.2 F (37.3 C)] 99.2 F (37.3 C) (02/15 0506) Pulse Rate:  [80-88] 84 (02/15 0506) Resp:  [16-20] 18 (02/15 0506) BP: (145-165)/(85-98) 145/95 (02/15 0506) SpO2:  [95 %-99 %] 95 % (02/15 0506) Last BM Date: 04/09/19  Intake/Output from previous day: 02/14 0701 - 02/15 0700 In: 2768.3 [I.V.:2338.2; IV Piggyback:430.1] Out: 1050 [Urine:1050] Intake/Output this shift: No intake/output data recorded.  PE: Gen:  Alert, NAD, pleasant HEENT: EOM's intact, pupils equal and round Card:  RRR Pulm:  CTAB, no W/R/R, rate and effort normal Abd: Soft, ND, nontender, +BS, midline incision with staples intact and no erythema or drainage/ opening at distal aspect of wound with staples removed and no active drainage, no purulent drainage noted on dressing, wound tunnels about 8cm proximally     Lab Results:  Recent Labs    04/12/19 0525 04/13/19 0500  WBC 13.6* 15.8*  HGB 11.1* 10.5*  HCT 33.6* 32.1*  PLT 330 381   BMET Recent Labs    04/12/19 0525 04/13/19 0500  NA 134* 138  K 3.7 4.3  CL 106 108  CO2 19* 20*  GLUCOSE 85 84  BUN 10 8  CREATININE 0.65 0.61  CALCIUM 8.3* 8.4*   PT/INR No results for input(s): LABPROT, INR in the last 72 hours. CMP     Component Value Date/Time   NA 138 04/13/2019 0500   K 4.3 04/13/2019 0500   CL 108 04/13/2019 0500   CO2 20 (L) 04/13/2019 0500   GLUCOSE 84 04/13/2019 0500   BUN 8 04/13/2019 0500   CREATININE 0.61 04/13/2019 0500   CALCIUM 8.4 (L) 04/13/2019 0500   PROT 6.0  (L) 04/11/2019 0438   ALBUMIN 2.6 (L) 04/11/2019 0438   AST 14 (L) 04/11/2019 0438   ALT 20 04/11/2019 0438   ALKPHOS 76 04/11/2019 0438   BILITOT 1.1 04/11/2019 0438   GFRNONAA >60 04/13/2019 0500   GFRAA >60 04/13/2019 0500   Lipase     Component Value Date/Time   LIPASE 47 04/04/2019 1559       Studies/Results: No results found.  Anti-infectives: Anti-infectives (From admission, onward)   Start     Dose/Rate Route Frequency Ordered Stop   04/12/19 1500  piperacillin-tazobactam (ZOSYN) IVPB 3.375 g     3.375 g 12.5 mL/hr over 240 Minutes Intravenous Every 8 hours 04/12/19 0847     04/12/19 0930  piperacillin-tazobactam (ZOSYN) IVPB 3.375 g     3.375 g 100 mL/hr over 30 Minutes Intravenous  Once 04/12/19 0846 04/12/19 1100   04/05/19 0654  sodium chloride 0.9 % with cefTRIAXone (ROCEPHIN) ADS Med    Note to Pharmacy: Carleene Cooper   : cabinet override      04/05/19 0654 04/05/19 1859       Assessment/Plan Hypertension- order home norvasc  Cecal volvulus with small bowel obstruction -Exploratory laparotomy, right colectomy, incidental appendectomy, lysis of adhesions, 04/05/2019, Dr. Lorine Bears POD #9 - tolerating clears and having bowel function - pack wound twice daily with saline dampened gauze, ok to shower with wound  open  FEN: IV fluids, FLD  ID: IV Zosyn 2/13>>. Check WBC, if trending down likely can stop abx DVT: SQH, SCDs Follow-up: Dr. Carolynne Edouard  Plan: Advance to full liquids and advance diet as tolerated to soft diet. Recheck CBC today. Continue mobilizing. Will order [facility] nursing to help with would care.    LOS: 10 days    Franne Forts, [facility] 04/14/2019, 8:39 AM Please see Amion for pager number during day hours 7:00am-4:30pm

## 2019-04-14 NOTE — TOC Initial Note (Signed)
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  She states daughter lives there and is currently helping out-she feels confident with daughter's help she will be able to do dressing changes and wound care.  Is fine with referral to [facility] RN, no preferences identified.  Referred to and accepted by [facility].  Patient requested order for walker and 3 in 1 at d/c. TOC will continue to follow during the course of hospitalization.                  Expected Discharge Plan: [place] Barriers to Discharge: No Barriers Identified   Patient Goals and CMS Choice   CMS Medicare.gov Compare Post Acute Care list provided to:: Patient Choice offered to / list presented to : Patient  Expected Discharge Plan and Services Expected Discharge Plan: [place]   Discharge Planning Services: CM Consult Post Acute Care Choice: Durable Medical Equipment, Home Health Living arrangements for the past 2 months: Single Family Home                           HH Arranged: RN [place] Agency: [facility] [employer]) Date [place]: 04/14/19 Time [place]: 1639 Representative spoke with at [place]: [place] Arrangements/Services Living arrangements for the past 2 months: [place] Lives with:: Spouse, Adult Children Patient language and need for interpreter reviewed:: Yes Do you feel safe going back to the place where you live?: Yes      Need for Family Participation in Patient Care: Yes (Comment) Care giver support system in place?: Yes (comment)   Criminal Activity/Legal  Involvement Pertinent to Current Situation/Hospitalization: No - Comment as needed  Activities of Daily Living Home Assistive Devices/Equipment: Blood pressure cuff ADL Screening (condition at time of admission) Patient's cognitive ability adequate to safely complete daily activities?: Yes Is the patient deaf or have difficulty hearing?: No Does the patient have difficulty seeing, even when wearing glasses/contacts?: No Does the patient have difficulty concentrating, remembering, or making decisions?: No Patient able to express need for assistance with ADLs?: Yes Does the patient have difficulty dressing or bathing?: No Independently performs ADLs?: Yes (appropriate for developmental age) Does the patient have difficulty walking or climbing stairs?: No Weakness of Legs: None Weakness of Arms/Hands: None  Permission Sought/Granted                  Emotional Assessment Appearance:: Appears stated age Attitude/Demeanor/Rapport: Engaged Affect (typically observed): Appropriate Orientation: : Oriented to Self, Oriented to Place, Oriented to  Time, Oriented to Situation Alcohol / Substance Use: Not Applicable Psych Involvement: No (comment)  Admission diagnosis:  Cecal volvulus ([place]) [K56.2] Patient Active Problem List   Diagnosis Date Noted  . Cecal volvulus ([place]) 04/04/2019   PCP:  Aletha Halim., PA-C Pharmacy:   [id number] - [place], [place] [address] Phone: [phone] Fax: [phone]  [id number] - [place], [place] - [address]  [address] [place] [address] Phone: [phone] Fax: [phone]     Social Determinants of Health (SDOH) Interventions    Readmission Risk Interventions No flowsheet data found.

## 2019-04-14 NOTE — Discharge Instructions (Signed)
Wet to Dry WOUND CARE: - Change dressing twice daily - Supplies: sterile saline, kerlex, scissors, ABD pads, tape  1. Remove dressing and all packing carefully, moistening with sterile saline as needed to avoid packing/internal dressing sticking to the wound. 2.   Clean edges of skin around the wound with water/gauze, making sure there is no tape debris or leakage left on skin that could cause skin irritation or breakdown. 3.   Dampen and clean kerlex with sterile saline and pack wound from wound base to skin level, making sure to take note of any possible areas of wound tracking, tunneling and packing appropriately. Wound can be packed loosely. Trim kerlex to size if a whole kerlex is not required. 4.   Cover wound with a dry ABD pad and secure with tape.  5.   Write the date/time on the dry dressing/tape to better track when the last dressing change occurred. - apply any skin protectant/powder if recommended by clinician to protect skin/skin folds. - change dressing as needed if leakage occurs, wound gets contaminated, or patient requests to shower. - You may shower daily with wound open and following the shower the wound should be dried and a clean dressing placed.  - Medical grade tape as well as packing supplies can be found at [employer] on [address]. The remaining supplies can be found at your local drug store, [employer] etc.   CCS      [facility] [phone]  OPEN ABDOMINAL SURGERY: POST OP INSTRUCTIONS  Always review your discharge instruction sheet given to you by the facility where your surgery was performed.  IF YOU HAVE DISABILITY OR FAMILY LEAVE FORMS, YOU MUST BRING THEM TO THE OFFICE FOR PROCESSING.  PLEASE DO NOT GIVE THEM TO YOUR DOCTOR.  1. A prescription for pain medication may be given to you upon discharge.  Take your pain medication as prescribed, if needed.  If narcotic pain medicine is not needed, then you may take acetaminophen (Tylenol) or  ibuprofen (Advil) as needed. 2. Take your usually prescribed medications unless otherwise directed. 3. If you need a refill on your pain medication, please contact your pharmacy. They will contact our office to request authorization.  Prescriptions will not be filled after 5pm or on week-ends. 4. You should follow a light diet the first few days after arrival home, such as soup and crackers, pudding, etc.unless your doctor has advised otherwise. A high-fiber, low fat diet can be resumed as tolerated.   Be sure to include lots of fluids daily. Most patients will experience some swelling and bruising on the chest and neck area.  Ice packs will help.  Swelling and bruising can take several days to resolve 5. Most patients will experience some swelling and bruising in the area of the incision. Ice pack will help. Swelling and bruising can take several days to resolve..  6. It is common to experience some constipation if taking pain medication after surgery.  Increasing fluid intake and taking a stool softener will usually help or prevent this problem from occurring.  A mild laxative (Milk of Magnesia or Miralax) should be taken according to package directions if there are no bowel movements after 48 hours. 7.  ACTIVITIES:  You may resume regular (light) daily activities beginning the next day--such as daily self-care, walking, climbing stairs--gradually increasing activities as tolerated.  You may have sexual intercourse when it is comfortable.  Refrain from any heavy lifting or straining until approved by your doctor. a.   You may drive when you no longer are taking prescription pain medication, you can comfortably wear a seatbelt, and you can safely maneuver your car and apply brakes 8. You should see your doctor in the office for a follow-up appointment approximately two weeks after your surgery.  Make sure that you call for this appointment within a day or two after you arrive home to insure a convenient  appointment time. OTHER INSTRUCTIONS:  _____________________________________________________________ _____________________________________________________________  WHEN TO CALL YOUR DOCTOR: 1. Fever over 101.0 2. Inability to urinate 3. Nausea and/or vomiting 4. Extreme swelling or bruising 5. Continued bleeding from incision. 6. Increased pain, redness, or drainage from the incision. 7. Difficulty swallowing or breathing 8. Muscle cramping or spasms. 9. Numbness or tingling in hands or feet or around lips.  The clinic staff is available to answer your questions during regular business hours.  Please don't hesitate to call and ask to speak to one of the nurses if you have concerns.  For further questions, please visit [email or web address]    

## 2019-04-15 LAB — CBC
HCT: 36.4 % (ref 36.0–46.0)
Hemoglobin: 12.1 g/dL (ref 12.0–15.0)
MCH: 28.1 pg (ref 26.0–34.0)
MCHC: 33.2 g/dL (ref 30.0–36.0)
MCV: 84.5 fL (ref 80.0–100.0)
Platelets: 419 10*3/uL — ABNORMAL HIGH (ref 150–400)
RBC: 4.31 MIL/uL (ref 3.87–5.11)
RDW: 14.3 % (ref 11.5–15.5)
WBC: 10 10*3/uL (ref 4.0–10.5)
nRBC: 0.2 % (ref 0.0–0.2)

## 2019-04-15 MED ORDER — ACETAMINOPHEN 500 MG PO TABS: 1000.0000 mg | ORAL_TABLET | Freq: Four times a day (QID) | ORAL | 0 refills | Status: AC | PRN

## 2019-04-15 MED ORDER — IBUPROFEN 600 MG PO TABS: 600.0000 mg | ORAL_TABLET | Freq: Four times a day (QID) | ORAL | 0 refills | Status: AC | PRN

## 2019-04-15 MED ORDER — PANTOPRAZOLE SODIUM 40 MG PO TBEC: 40.0000 mg | DELAYED_RELEASE_TABLET | Freq: Every day | ORAL | Status: DC

## 2019-04-15 NOTE — Discharge Summary (Signed)
[facility] Discharge Summary   Patient ID: Tasha Bishop MRN: [id number] DOB/AGE: 08-06-1970 49 y.o.  Admit date: 04/04/2019 Discharge date: 04/15/2019  Admitting Diagnosis: Cecal volvulus Partial SBO  Discharge Diagnosis Patient Active Problem List   Diagnosis Date Noted  . Cecal volvulus (HCC) 04/04/2019    Consultants None  Imaging: No results found.  Procedures Dr. Carolynne Edouard (04/05/2019) - EXPLORATORY LAPAROTOMY, right colectomy, incidental appendectomy, lysis of adhesions  Hospital Course:  Tasha Bishop is a 49yo female PMH HTN who presented to [facility] 2[facility] with 8 days of abdominal pain, nausea, vomiting, and diarrhea. CT scan showed some evidence of cecal volvulus causing a small bowel obstruction. Patient was admitted to the surgical service for IV hydration and bowel rest. She was taken to the operating room the following day for exploratory laparotomy, right colectomy, incidental appendectomy, and lysis of adhesions.  Tolerated procedure well and was transferred to the floor.  Patient did have an ileus postoperatively as expected. Due to prolonged ileus and leukocytosis, a CT scan was repeated 04/10/2019 which showed no intraabdominal abscess, but was consistent with postoperative ileus and revealed a subcutaneous fluid collection. Some staples from the abdominal incision were removed and pus expressed. Twice daily wet to dry dressing changes were initiated. Once bowel function returned diet was advanced as tolerated. On POD10, the patient was voiding well, tolerating diet, ambulating well, pain well controlled, vital signs stable and felt stable for discharge home.  Patient will follow up as below and knows to call with questions or concerns.     Physical Exam: Gen:  Alert, NAD, pleasant Pulm:  rate and effort normal Abd: Soft, ND, nontender, +BS, midline incision with staples intact and no erythema or drainage/ opening at distal aspect of wound with staples removed  and no active drainage, wound tunnels about 8cm proximally   Allergies as of 04/15/2019      Reactions   Percocet [oxycodone-acetaminophen] Itching   Itching many years ago, take Tylenol at home without issue      Medication List    STOP taking these medications   cephALEXin 500 MG capsule Commonly known as: KEFLEX     TAKE these medications   acetaminophen 500 MG tablet Commonly known as: TYLENOL Take 2 tablets (1,000 mg total) by mouth every 6 (six) hours as needed for mild pain.   amLODipine 5 MG tablet Commonly known as: NORVASC Take 5 mg by mouth daily.   ibuprofen 600 MG tablet Commonly known as: ADVIL Take 1 tablet (600 mg total) by mouth every 6 (six) hours as needed (pain not controlled with tylenol).   ranitidine 150 MG tablet Commonly known as: ZANTAC Take 1 tablet (150 mg total) by mouth 2 (two) times daily.   Uribel 118 MG Caps Take 1 capsule by mouth in the morning, at noon, in the evening, and at bedtime.            Durable Medical Equipment  (From admission, onward)         Start     Ordered   04/15/19 0654  For home use only DME 3 n 1  Once     04/15/19 0654   04/15/19 0654  For home use only DME Walker rolling  Once    Question Answer Comment  Walker: With 5 Inch Wheels   Patient needs a walker to treat with the following condition Cecal volvulus (HCC)   Patient needs a walker to treat with the following condition Status post  exploratory laparotomy      04/15/19 4196           Follow-up Information    Jovita Kussmaul, MD. Go on 05/06/2019.   Specialty: General Surgery Why: Your appointment is 3/9 at 9:10am Please arrive 15 minutes early to check in. Contact information: [address] [place] [address] [phone]        [facility] [place]. Go on 04/21/2019.   Specialty: General Surgery Why: Your appointment is 04/21/2019 at 2pm Please arrive 30 minutes prior to your appointment to check in and fill out  paperwork. Bring photo ID and insurance information. Contact information: [address] [place] [phone]       Aletha Halim., PA-C. Call.   Specialty: Family Medicine Why: Call to arrange post-hospitalization follow up appointment Contact information: [address] [place] [address] [phone]        [facility] Follow up.   Specialty: [place] Why: This is the home health agency that will be sending out a nurse to work with you and make sure your wound in healing as it should, and getting the care it needs Contact information: [address] [place] [address] [phone]           Signed: Wellington [place], [facility] 04/15/2019, 7:56 AM Please see Amion for pager number during day hours 7:00am-4:30pm

## 2019-04-15 NOTE — Progress Notes (Signed)
Provided discharge education for patient. Pt verbalized understanding.

## 2019-06-05 ENCOUNTER — Other Ambulatory Visit: Payer: Self-pay | Admitting: General Surgery

## 2019-06-05 DIAGNOSIS — K562 Volvulus: Secondary | ICD-10-CM

## 2019-06-12 ENCOUNTER — Other Ambulatory Visit: Payer: Self-pay | Admitting: General Surgery

## 2019-06-12 DIAGNOSIS — K562 Volvulus: Secondary | ICD-10-CM

## 2019-06-16 ENCOUNTER — Ambulatory Visit
Admission: RE | Admit: 2019-06-16 | Discharge: 2019-06-16 | Disposition: A | Discharge status: Home / Self Care | Payer: BC Managed Care – PPO | Source: Ambulatory Visit | Attending: General Surgery | Admitting: General Surgery

## 2019-06-16 DIAGNOSIS — K562 Volvulus: Secondary | ICD-10-CM

## 2020-10-26 ENCOUNTER — Other Ambulatory Visit: Payer: Self-pay

## 2020-10-26 ENCOUNTER — Encounter (HOSPITAL_BASED_OUTPATIENT_CLINIC_OR_DEPARTMENT_OTHER): Payer: Self-pay

## 2020-10-26 DIAGNOSIS — Z5321 Procedure and treatment not carried out due to patient leaving prior to being seen by health care provider: Secondary | ICD-10-CM | POA: Insufficient documentation

## 2020-10-26 DIAGNOSIS — R112 Nausea with vomiting, unspecified: Secondary | ICD-10-CM | POA: Diagnosis not present

## 2020-10-26 DIAGNOSIS — R1030 Lower abdominal pain, unspecified: Secondary | ICD-10-CM | POA: Diagnosis present

## 2020-10-26 LAB — COMPREHENSIVE METABOLIC PANEL WITH GFR
ALT: 30 U/L (ref 0–44)
AST: 22 U/L (ref 15–41)
Albumin: 4.5 g/dL (ref 3.5–5.0)
Alkaline Phosphatase: 90 U/L (ref 38–126)
Anion gap: 10 (ref 5–15)
BUN: 22 mg/dL — ABNORMAL HIGH (ref 6–20)
CO2: 25 mmol/L (ref 22–32)
Calcium: 9.8 mg/dL (ref 8.9–10.3)
Chloride: 103 mmol/L (ref 98–111)
Creatinine, Ser: 0.89 mg/dL (ref 0.44–1.00)
GFR, Estimated: >60 mL/min
Glucose, Bld: 115 mg/dL — ABNORMAL HIGH (ref 70–99)
Potassium: 3.7 mmol/L (ref 3.5–5.1)
Sodium: 138 mmol/L (ref 135–145)
Total Bilirubin: 0.9 mg/dL (ref 0.3–1.2)
Total Protein: 8.2 g/dL — ABNORMAL HIGH (ref 6.5–8.1)

## 2020-10-26 LAB — CBC
HCT: 41 % (ref 36.0–46.0)
Hemoglobin: 13.5 g/dL (ref 12.0–15.0)
MCH: 28.2 pg (ref 26.0–34.0)
MCHC: 32.9 g/dL (ref 30.0–36.0)
MCV: 85.8 fL (ref 80.0–100.0)
Platelets: 301 10*3/uL (ref 150–400)
RBC: 4.78 MIL/uL (ref 3.87–5.11)
RDW: 14.5 % (ref 11.5–15.5)
WBC: 7.6 10*3/uL (ref 4.0–10.5)
nRBC: 0 % (ref 0.0–0.2)

## 2020-10-26 LAB — LIPASE, BLOOD: Lipase: 11 U/L (ref 11–51)

## 2020-10-26 NOTE — ED Triage Notes (Signed)
Patient arrives from home with c/o lower abd pain, n/v. Patient abd is bloated and states this feels like "them time my large intestines got twisted"

## 2020-10-27 ENCOUNTER — Emergency Department (HOSPITAL_BASED_OUTPATIENT_CLINIC_OR_DEPARTMENT_OTHER)
Admission: EM | Admit: 2020-10-27 | Discharge: 2020-10-27 | Discharge status: Against Medical Advice | Payer: BC Managed Care – PPO | Attending: Emergency Medicine | Admitting: Emergency Medicine

## 2020-10-27 NOTE — ED Notes (Signed)
Patient called several times for Examination Room with no Response.

## 2021-06-23 IMAGING — CT CT ABD-PELV W/ CM
2 of 5 series · 14 of 46 positions shown, 16 images · IV contrast (omnipaque)
Comparison: Abdominal ultrasound 06/07/2011

CLINICAL DATA: Abdominal pain, worsening nausea and vomiting,
recent treatment for UTI

EXAM:
CT ABDOMEN AND PELVIS WITH CONTRAST
TECHNIQUE: Multidetector CT imaging of the abdomen and pelvis was performed
using the standard protocol following bolus administration of
intravenous contrast.
CONTRAST:  100mL OMNIPAQUE IOHEXOL 300 MG/ML  SOLN

[Series 2: axial st · axial · 0.72mm/px · z∈[+1034,+1444]mm · 11 of 96 slices shown, 13 images]
[im 7/96  soft-tissue]
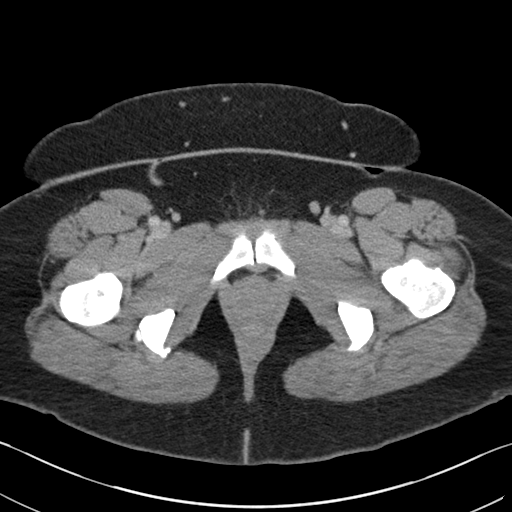
[im 7/96  bone]
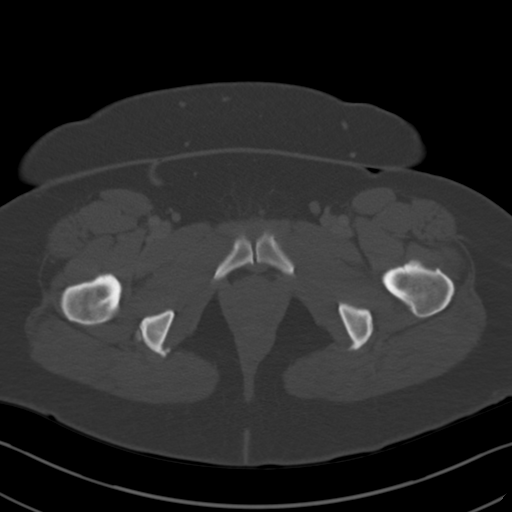
[im 14/96  soft-tissue]
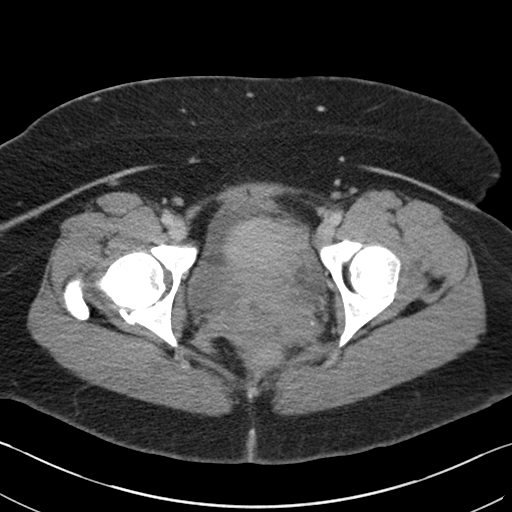
[im 21/96  soft-tissue]
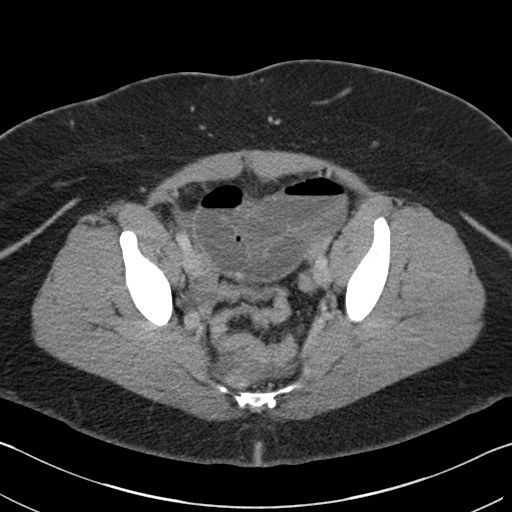
[im 34/96  soft-tissue]
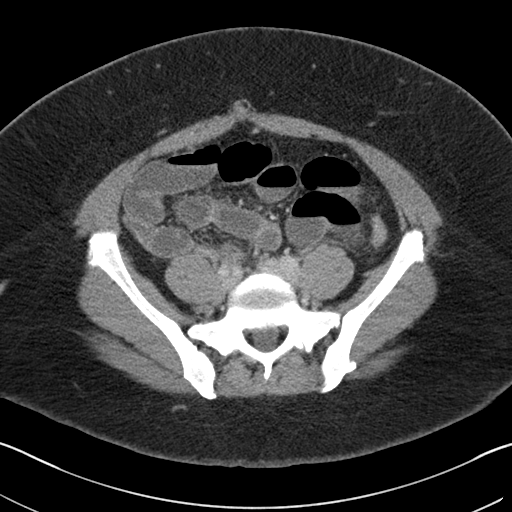
[im 41/96  soft-tissue]
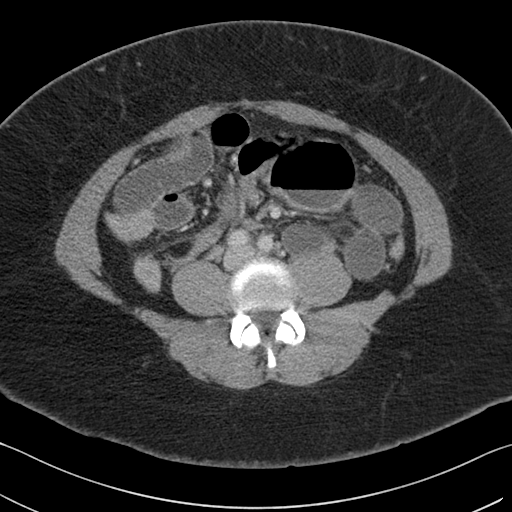
[im 48/96  soft-tissue]
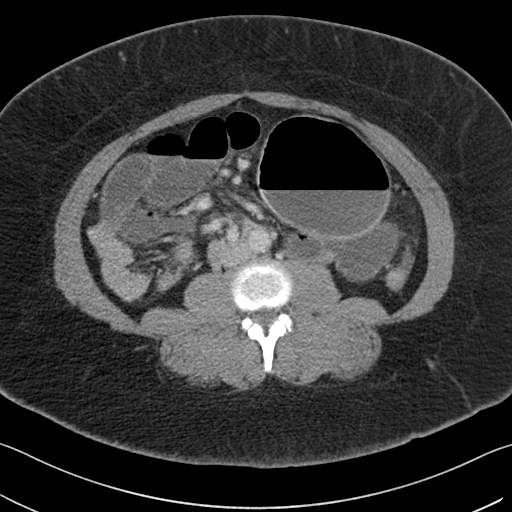
[im 55/96  soft-tissue]
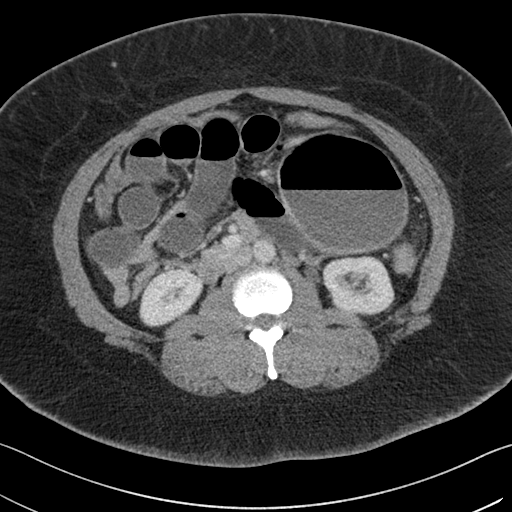
[im 62/96  soft-tissue]
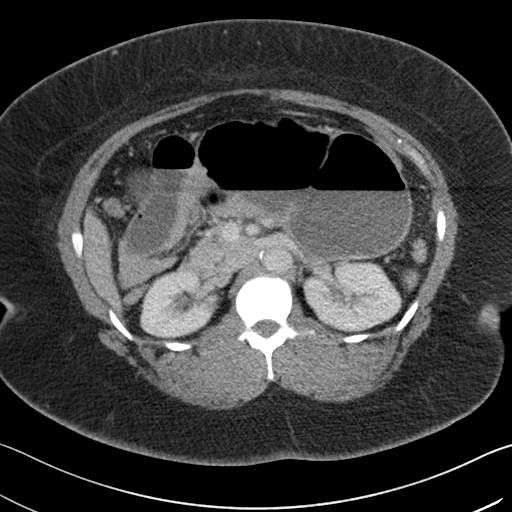
[im 75/96  soft-tissue]
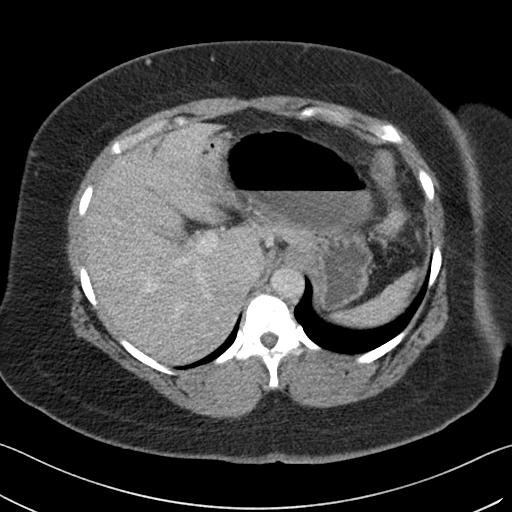
[im 75/96  bone]
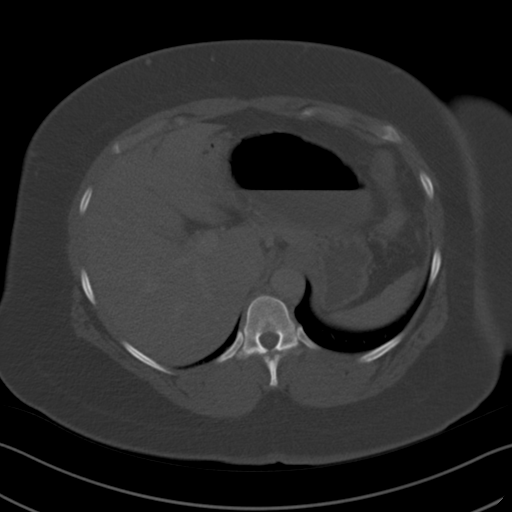
[im 82/96  soft-tissue]
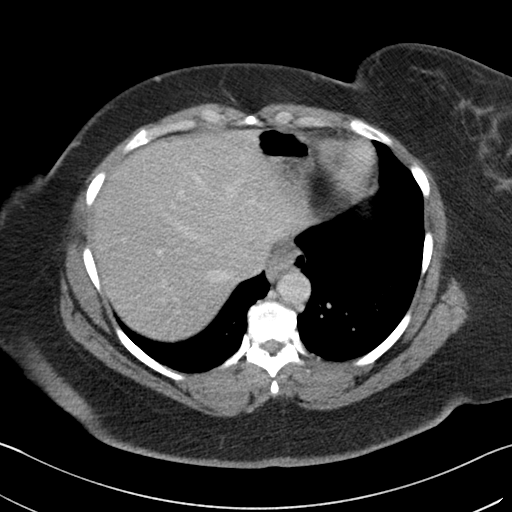
[im 89/96  soft-tissue]
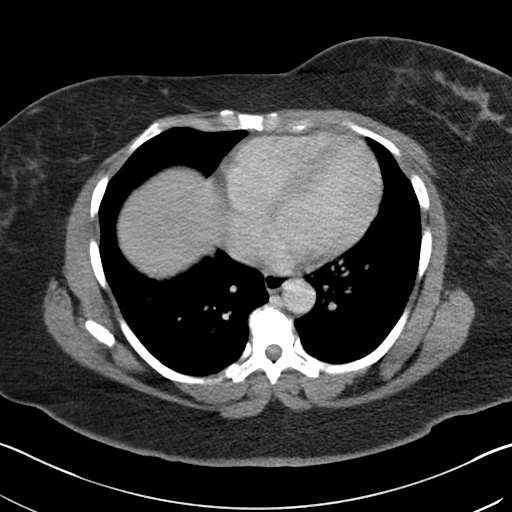

[Series 5: coronal st · coronal · 0.72mm/px · 3 of 160 slices shown]
[im 54/160  soft-tissue]
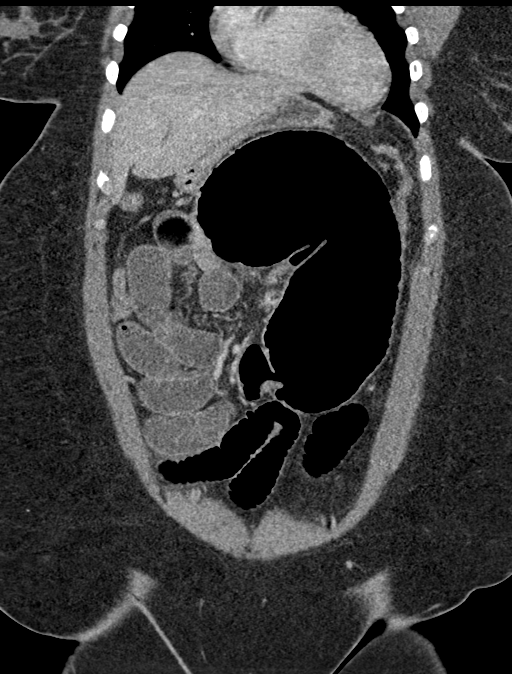
[im 71/160  soft-tissue]
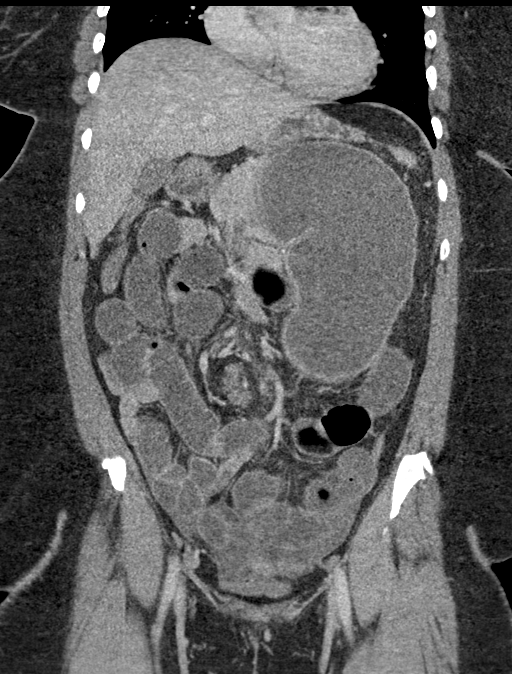
[im 89/160  soft-tissue]
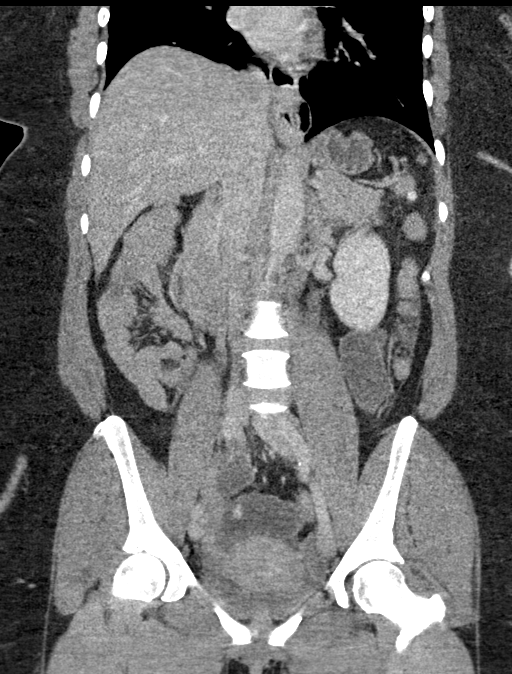

[14 of 46 positions shown; findings below may reference images not displayed]

FINDINGS: Lower chest: Lung bases are clear. Normal heart size. No pericardial
effusion.

Hepatobiliary: No focal liver abnormality is seen. No gallstones,
gallbladder wall thickening, or biliary dilatation.

Pancreas: Mild compression of the pancreatic body and proximal tail
by the distended, displaced cecum. No pancreatic ductal dilatation.

Spleen: Normal in size without focal abnormality.

Adrenals/Urinary Tract: Adrenal glands are unremarkable. Kidneys are
normal, without renal calculi, focal lesion, or hydronephrosis. Mild
bladder wall thickening and perivesicular hazy stranding, poorly
assessed given poor distension of the bladder on this CT
examination.

Stomach/Bowel: Small hiatal hernia. Distal stomach and duodenum are
unremarkable. There is air and fluid distention of the distal small
bowel with a twisting closed loop obstruction involving the terminal
ileum and proximal cecum which is displaced into the left upper
quadrant. Paired transition points are noted in the mid abdomen
([DATE]). There is mild mural thickening and hyperemia of the
distended cecum with adjacent inflammatory changes and likely
reactive fluid tracking in the pericolic gutters to the deep pelvis.
Distal colon is decompressed.

Vascular/Lymphatic: Atherosclerotic plaque within the normal caliber
aorta. Twisting of the mesenteric vessels supplying the cecum and
terminal ileum involves with the process detailed above. Some mild
mesenteric congestion is noted.

Reproductive: Anteverted uterus. Bandlike hypoattenuation in the
lower uterine segment likely reflecting postsurgical change related
to prior Caesarean. Additional fluid attenuation cystic structures
towards the cervix likely nabothian cysts. No concerning adnexal
lesions.

Other: Small volume free fluid in the pelvis, favor reactive.
Inflammatory changes adjacent the distended cecal loop in the upper
midline and left upper quadrant. No free air is seen. Mild posterior
body wall edema.

Musculoskeletal: No acute osseous abnormality or suspicious osseous
lesion. Facet degenerative changes are noted in the spine at L4-5.
Corticated crescentic calcification along the right superior
acetabular rim, may reflect prior acetabular labral injury or other
degenerative change.
IMPRESSION: 1. Features of closed loop cecal volvulus with some edematous mural
thickening and inflammatory changes worrisome for developing
vascular compromise. Resulting upstream dilatation of the small
bowel with distal decompression of the colon.
2. Twisting about the mesenteric pedicle with swirling of the
mesenteric vessels and mild mesenteric congestion as well.
3. Small volume fluid in the pelvis, likely reactive.
4. Mild bladder wall thickening and perivesicular hazy stranding.
Recommend correlation with urinalysis to exclude the possibility of
cystitis.
5. Aortic Atherosclerosis ([id number]).

These results were called by telephone at the time of interpretation
on 04/04/2019 at [DATE] to provider JETSE MAGDALENA , who
verbally acknowledged these results.
